# Patient Record
Sex: Female | Born: 1935 | Race: White | Hispanic: No | Smoking: Former smoker
Health system: Southern US, Community
[De-identification: ages and names within clinical notes are randomized; demographics above are authoritative.]

## PROBLEM LIST (undated history)

## (undated) DIAGNOSIS — N3010 Interstitial cystitis (chronic) without hematuria: Secondary | ICD-10-CM

## (undated) DIAGNOSIS — R000 Tachycardia, unspecified: Secondary | ICD-10-CM

## (undated) HISTORY — PX: CYSTOSTOMY W/ BLADDER BIOPSY: SHX1431

---

## 2006-05-01 ENCOUNTER — Encounter: Admission: RE | Admit: 2006-05-01 | Discharge: 2006-05-01 | Payer: Self-pay | Admitting: Urology

## 2006-05-05 ENCOUNTER — Ambulatory Visit (HOSPITAL_BASED_OUTPATIENT_CLINIC_OR_DEPARTMENT_OTHER): Admission: RE | Admit: 2006-05-05 | Discharge: 2006-05-05 | Payer: Self-pay | Admitting: Urology

## 2013-09-19 ENCOUNTER — Emergency Department (HOSPITAL_BASED_OUTPATIENT_CLINIC_OR_DEPARTMENT_OTHER)
Admission: EM | Admit: 2013-09-19 | Discharge: 2013-09-20 | Disposition: A | Discharge status: Home / Self Care | Payer: Medicare Other | Attending: Emergency Medicine | Admitting: Emergency Medicine

## 2013-09-19 ENCOUNTER — Encounter (HOSPITAL_BASED_OUTPATIENT_CLINIC_OR_DEPARTMENT_OTHER): Payer: Self-pay | Admitting: Emergency Medicine

## 2013-09-19 DIAGNOSIS — Y846 Urinary catheterization as the cause of abnormal reaction of the patient, or of later complication, without mention of misadventure at the time of the procedure: Secondary | ICD-10-CM | POA: Insufficient documentation

## 2013-09-19 DIAGNOSIS — T83091A Other mechanical complication of indwelling urethral catheter, initial encounter: Secondary | ICD-10-CM

## 2013-09-19 DIAGNOSIS — Z87448 Personal history of other diseases of urinary system: Secondary | ICD-10-CM | POA: Insufficient documentation

## 2013-09-19 DIAGNOSIS — T8389XA Other specified complication of genitourinary prosthetic devices, implants and grafts, initial encounter: Secondary | ICD-10-CM | POA: Insufficient documentation

## 2013-09-19 DIAGNOSIS — Z79899 Other long term (current) drug therapy: Secondary | ICD-10-CM | POA: Insufficient documentation

## 2013-09-19 HISTORY — DX: Interstitial cystitis (chronic) without hematuria: N30.10

## 2013-09-19 HISTORY — DX: Tachycardia, unspecified: R00.0

## 2013-09-19 NOTE — ED Notes (Signed)
States she had bladder surgery at [facility] today. Foley is stopped up and is having urinary retention. Abdominal pain.

## 2013-09-19 NOTE — ED Provider Notes (Signed)
CSN: [id number]     Arrival date & time 09/19/13  2205 History   First MD Initiated Contact with Patient 09/19/13 2347     This chart was scribed for Hanley Seamen, MD by Arlan Organ, ED Scribe. This patient was seen in room MH08/MH08 and the patient's care was started 11:52 PM.   Chief Complaint  Patient presents with  . Urinary Retention   HPI HPI Comments: Courtney Gamble is a 79 y.o. female who presents to the Emergency Department complaining of urinary retention that started today. Pt had a cystoscopy with biopsy for evaluation of interstitial cystitis. She was discharged home with an indwelling Foley catheter. Her Foley catheter became occluded and started causing her discomfort. Pt describes the pain as 9/10 at time of arrival, but has been resolved to 0/10 after the Foley was irrigated by her ED nurse. Pt denies nausea, vomiting, fever, chills.  Past Medical History  Diagnosis Date  . Tachycardia   . Interstitial cystitis    No past surgical history on file. No family history on file. History  Substance Use Topics  . Smoking status: Not on file  . Smokeless tobacco: Not on file  . Alcohol Use: Not on file   OB History   Grav Para Term Preterm Abortions TAB SAB Ect Mult Living                 Review of Systems  All other systems reviewed and are negative.    Allergies  Review of patient's allergies indicates not on file.  Home Medications   Current Outpatient Rx  Name  Route  Sig  Dispense  Refill  . amitriptyline (ELAVIL) 10 MG tablet   Oral   Take 10 mg by mouth at bedtime.         . Aspirin (ASPIR-81 PO)   Oral   Take by mouth.         . ATENOLOL PO   Oral   Take by mouth.         . diazepam (VALIUM) 5 MG tablet   Oral   Take 5 mg by mouth every 6 (six) hours as needed for anxiety.         . hydrOXYzine (ATARAX/VISTARIL) 10 MG tablet   Oral   Take 10 mg by mouth 3 (three) times daily as needed for itching.         Marland Kitchen oxybutynin  (DITROPAN) 5 MG tablet   Oral   Take 5 mg by mouth 3 (three) times daily.         . Sulfamethoxazole-Trimethoprim (BACTRIM DS PO)   Oral   Take by mouth.         . traMADol (ULTRAM) 50 MG tablet   Oral   Take 50 mg by mouth every 6 (six) hours as needed for pain.          BP 157/62  Pulse 82  Temp(Src) 98.1 F (36.7 C) (Oral)  Resp 20  Ht 5\' 6"  (1.676 m)  Wt 136 lb (61.689 kg)  BMI 21.96 kg/m2  SpO2 94%  Physical Exam  General: Well-developed, well-nourished female in no acute distress; appearance consistent with age of record HENT: normocephalic; atraumatic Eyes: pupils equal, round and reactive to light; extraocular muscles intact. Lens implants. Neck: supple Heart: regular rate and rhythm; no murmurs, rubs or gallops Lungs: clear to auscultation bilaterally Abdomen: soft; nondistended; mild suprapubic tenderness; no masses or hepatosplenomegaly; bowel sounds present Extremities: No deformity; full  range of motion; pulses normal Neurologic: Awake, alert and oriented; motor function intact in all extremities and symmetric; no facial droop Skin: Warm and dry Psychiatric: Normal mood and affect  GU: Foley catheter draining dark yellow urine.  ED Course  Procedures (including critical care time)  DIAGNOSTIC STUDIES: Oxygen Saturation is 94% on RA, Adequate by my interpretation.    COORDINATION OF CARE: 11:55 PM-Discussed treatment plan with pt at bedside and pt agreed to plan.    MDM   1. Obstructed Foley catheter, initial encounter    I personally performed the services described in this documentation, which was scribed in my presence.  The recorded information has been reviewed and is accurate.   Hanley Seamen, MD 09/20/13 0002

## 2013-09-20 ENCOUNTER — Encounter (HOSPITAL_BASED_OUTPATIENT_CLINIC_OR_DEPARTMENT_OTHER): Payer: Self-pay | Admitting: Emergency Medicine

## 2013-09-20 NOTE — ED Notes (Signed)
D/c home with family to drive- cao x 4

## 2015-11-28 DIAGNOSIS — M810 Age-related osteoporosis without current pathological fracture: Secondary | ICD-10-CM | POA: Diagnosis not present

## 2015-11-28 DIAGNOSIS — I2510 Atherosclerotic heart disease of native coronary artery without angina pectoris: Secondary | ICD-10-CM | POA: Diagnosis not present

## 2015-11-28 DIAGNOSIS — I252 Old myocardial infarction: Secondary | ICD-10-CM | POA: Diagnosis not present

## 2015-11-28 DIAGNOSIS — N3010 Interstitial cystitis (chronic) without hematuria: Secondary | ICD-10-CM | POA: Diagnosis not present

## 2015-11-28 DIAGNOSIS — E7800 Pure hypercholesterolemia, unspecified: Secondary | ICD-10-CM | POA: Diagnosis not present

## 2015-12-16 DIAGNOSIS — G4719 Other hypersomnia: Secondary | ICD-10-CM | POA: Diagnosis not present

## 2015-12-16 DIAGNOSIS — F411 Generalized anxiety disorder: Secondary | ICD-10-CM | POA: Diagnosis not present

## 2015-12-16 DIAGNOSIS — G4733 Obstructive sleep apnea (adult) (pediatric): Secondary | ICD-10-CM | POA: Diagnosis not present

## 2015-12-19 DIAGNOSIS — G4719 Other hypersomnia: Secondary | ICD-10-CM | POA: Diagnosis not present

## 2015-12-19 DIAGNOSIS — G4700 Insomnia, unspecified: Secondary | ICD-10-CM | POA: Diagnosis not present

## 2015-12-19 DIAGNOSIS — F411 Generalized anxiety disorder: Secondary | ICD-10-CM | POA: Diagnosis not present

## 2015-12-19 DIAGNOSIS — G4733 Obstructive sleep apnea (adult) (pediatric): Secondary | ICD-10-CM | POA: Diagnosis not present

## 2015-12-21 DIAGNOSIS — G4733 Obstructive sleep apnea (adult) (pediatric): Secondary | ICD-10-CM | POA: Diagnosis not present

## 2015-12-27 DIAGNOSIS — F411 Generalized anxiety disorder: Secondary | ICD-10-CM | POA: Diagnosis not present

## 2015-12-28 DIAGNOSIS — R350 Frequency of micturition: Secondary | ICD-10-CM | POA: Diagnosis not present

## 2015-12-28 DIAGNOSIS — N342 Other urethritis: Secondary | ICD-10-CM | POA: Diagnosis not present

## 2015-12-28 DIAGNOSIS — N3010 Interstitial cystitis (chronic) without hematuria: Secondary | ICD-10-CM | POA: Diagnosis not present

## 2015-12-28 DIAGNOSIS — R300 Dysuria: Secondary | ICD-10-CM | POA: Diagnosis not present

## 2015-12-28 DIAGNOSIS — E0590 Thyrotoxicosis, unspecified without thyrotoxic crisis or storm: Secondary | ICD-10-CM | POA: Diagnosis not present

## 2016-01-08 DIAGNOSIS — H04123 Dry eye syndrome of bilateral lacrimal glands: Secondary | ICD-10-CM | POA: Diagnosis not present

## 2016-01-08 DIAGNOSIS — H524 Presbyopia: Secondary | ICD-10-CM | POA: Diagnosis not present

## 2016-01-21 DIAGNOSIS — R350 Frequency of micturition: Secondary | ICD-10-CM | POA: Diagnosis not present

## 2016-01-21 DIAGNOSIS — R3989 Other symptoms and signs involving the genitourinary system: Secondary | ICD-10-CM | POA: Diagnosis not present

## 2016-01-21 DIAGNOSIS — E7800 Pure hypercholesterolemia, unspecified: Secondary | ICD-10-CM | POA: Diagnosis not present

## 2016-01-21 DIAGNOSIS — N3010 Interstitial cystitis (chronic) without hematuria: Secondary | ICD-10-CM | POA: Diagnosis not present

## 2016-01-22 DIAGNOSIS — N3941 Urge incontinence: Secondary | ICD-10-CM | POA: Diagnosis not present

## 2016-01-22 DIAGNOSIS — N3010 Interstitial cystitis (chronic) without hematuria: Secondary | ICD-10-CM | POA: Diagnosis not present

## 2016-01-23 DIAGNOSIS — E559 Vitamin D deficiency, unspecified: Secondary | ICD-10-CM | POA: Diagnosis not present

## 2016-01-23 DIAGNOSIS — E7800 Pure hypercholesterolemia, unspecified: Secondary | ICD-10-CM | POA: Diagnosis not present

## 2016-01-23 DIAGNOSIS — E0590 Thyrotoxicosis, unspecified without thyrotoxic crisis or storm: Secondary | ICD-10-CM | POA: Diagnosis not present

## 2016-01-24 DIAGNOSIS — N3010 Interstitial cystitis (chronic) without hematuria: Secondary | ICD-10-CM | POA: Diagnosis not present

## 2016-01-28 DIAGNOSIS — N3010 Interstitial cystitis (chronic) without hematuria: Secondary | ICD-10-CM | POA: Diagnosis not present

## 2016-01-29 DIAGNOSIS — N3010 Interstitial cystitis (chronic) without hematuria: Secondary | ICD-10-CM | POA: Diagnosis not present

## 2016-01-29 DIAGNOSIS — R339 Retention of urine, unspecified: Secondary | ICD-10-CM | POA: Diagnosis not present

## 2016-01-29 DIAGNOSIS — R3989 Other symptoms and signs involving the genitourinary system: Secondary | ICD-10-CM | POA: Diagnosis not present

## 2016-01-30 DIAGNOSIS — E0590 Thyrotoxicosis, unspecified without thyrotoxic crisis or storm: Secondary | ICD-10-CM | POA: Diagnosis not present

## 2016-01-30 DIAGNOSIS — R413 Other amnesia: Secondary | ICD-10-CM | POA: Diagnosis not present

## 2016-01-30 DIAGNOSIS — E559 Vitamin D deficiency, unspecified: Secondary | ICD-10-CM | POA: Diagnosis not present

## 2016-01-30 DIAGNOSIS — E7800 Pure hypercholesterolemia, unspecified: Secondary | ICD-10-CM | POA: Diagnosis not present

## 2016-01-30 DIAGNOSIS — K219 Gastro-esophageal reflux disease without esophagitis: Secondary | ICD-10-CM | POA: Diagnosis not present

## 2016-02-07 DIAGNOSIS — N3010 Interstitial cystitis (chronic) without hematuria: Secondary | ICD-10-CM | POA: Diagnosis not present

## 2016-02-11 DIAGNOSIS — N3010 Interstitial cystitis (chronic) without hematuria: Secondary | ICD-10-CM | POA: Diagnosis not present

## 2016-02-14 DIAGNOSIS — N3010 Interstitial cystitis (chronic) without hematuria: Secondary | ICD-10-CM | POA: Diagnosis not present

## 2016-02-18 DIAGNOSIS — N3010 Interstitial cystitis (chronic) without hematuria: Secondary | ICD-10-CM | POA: Diagnosis not present

## 2016-02-21 DIAGNOSIS — N3010 Interstitial cystitis (chronic) without hematuria: Secondary | ICD-10-CM | POA: Diagnosis not present

## 2016-02-25 DIAGNOSIS — R413 Other amnesia: Secondary | ICD-10-CM | POA: Diagnosis not present

## 2016-02-25 DIAGNOSIS — N3010 Interstitial cystitis (chronic) without hematuria: Secondary | ICD-10-CM | POA: Diagnosis not present

## 2016-02-25 DIAGNOSIS — I10 Essential (primary) hypertension: Secondary | ICD-10-CM | POA: Diagnosis not present

## 2016-02-25 DIAGNOSIS — M25511 Pain in right shoulder: Secondary | ICD-10-CM | POA: Diagnosis not present

## 2016-02-25 DIAGNOSIS — M79605 Pain in left leg: Secondary | ICD-10-CM | POA: Diagnosis not present

## 2016-02-29 DIAGNOSIS — M79605 Pain in left leg: Secondary | ICD-10-CM | POA: Diagnosis not present

## 2016-03-05 DIAGNOSIS — G301 Alzheimer's disease with late onset: Secondary | ICD-10-CM | POA: Diagnosis not present

## 2016-03-05 DIAGNOSIS — F0280 Dementia in other diseases classified elsewhere without behavioral disturbance: Secondary | ICD-10-CM | POA: Diagnosis not present

## 2016-03-05 DIAGNOSIS — R413 Other amnesia: Secondary | ICD-10-CM | POA: Diagnosis not present

## 2016-03-05 DIAGNOSIS — I10 Essential (primary) hypertension: Secondary | ICD-10-CM | POA: Diagnosis not present

## 2016-03-05 DIAGNOSIS — M79601 Pain in right arm: Secondary | ICD-10-CM | POA: Diagnosis not present

## 2016-03-14 DIAGNOSIS — N3010 Interstitial cystitis (chronic) without hematuria: Secondary | ICD-10-CM | POA: Diagnosis not present

## 2016-03-14 DIAGNOSIS — N390 Urinary tract infection, site not specified: Secondary | ICD-10-CM | POA: Diagnosis not present

## 2016-03-17 DIAGNOSIS — N3010 Interstitial cystitis (chronic) without hematuria: Secondary | ICD-10-CM | POA: Diagnosis not present

## 2016-03-19 DIAGNOSIS — N3010 Interstitial cystitis (chronic) without hematuria: Secondary | ICD-10-CM | POA: Diagnosis not present

## 2016-03-19 DIAGNOSIS — N390 Urinary tract infection, site not specified: Secondary | ICD-10-CM | POA: Diagnosis not present

## 2016-03-29 DIAGNOSIS — R300 Dysuria: Secondary | ICD-10-CM | POA: Diagnosis not present

## 2016-03-31 DIAGNOSIS — R300 Dysuria: Secondary | ICD-10-CM | POA: Diagnosis not present

## 2016-03-31 DIAGNOSIS — R3989 Other symptoms and signs involving the genitourinary system: Secondary | ICD-10-CM | POA: Diagnosis not present

## 2016-03-31 DIAGNOSIS — N3010 Interstitial cystitis (chronic) without hematuria: Secondary | ICD-10-CM | POA: Diagnosis not present

## 2016-04-01 DIAGNOSIS — N3010 Interstitial cystitis (chronic) without hematuria: Secondary | ICD-10-CM | POA: Diagnosis not present

## 2016-04-03 DIAGNOSIS — N3010 Interstitial cystitis (chronic) without hematuria: Secondary | ICD-10-CM | POA: Diagnosis not present

## 2016-04-09 DIAGNOSIS — M25511 Pain in right shoulder: Secondary | ICD-10-CM | POA: Diagnosis not present

## 2016-04-09 DIAGNOSIS — M25411 Effusion, right shoulder: Secondary | ICD-10-CM | POA: Diagnosis not present

## 2016-04-09 DIAGNOSIS — M7989 Other specified soft tissue disorders: Secondary | ICD-10-CM | POA: Diagnosis not present

## 2016-04-09 DIAGNOSIS — G8929 Other chronic pain: Secondary | ICD-10-CM | POA: Diagnosis not present

## 2016-04-17 DIAGNOSIS — M25611 Stiffness of right shoulder, not elsewhere classified: Secondary | ICD-10-CM | POA: Diagnosis not present

## 2016-04-17 DIAGNOSIS — M7551 Bursitis of right shoulder: Secondary | ICD-10-CM | POA: Diagnosis not present

## 2016-04-17 DIAGNOSIS — M65811 Other synovitis and tenosynovitis, right shoulder: Secondary | ICD-10-CM | POA: Diagnosis not present

## 2016-04-17 DIAGNOSIS — M25511 Pain in right shoulder: Secondary | ICD-10-CM | POA: Diagnosis not present

## 2016-04-17 DIAGNOSIS — M25411 Effusion, right shoulder: Secondary | ICD-10-CM | POA: Diagnosis not present

## 2016-04-17 DIAGNOSIS — G8929 Other chronic pain: Secondary | ICD-10-CM | POA: Diagnosis not present

## 2016-05-09 DIAGNOSIS — M7581 Other shoulder lesions, right shoulder: Secondary | ICD-10-CM | POA: Diagnosis not present

## 2016-05-09 DIAGNOSIS — G8929 Other chronic pain: Secondary | ICD-10-CM | POA: Diagnosis not present

## 2016-05-09 DIAGNOSIS — M25711 Osteophyte, right shoulder: Secondary | ICD-10-CM | POA: Diagnosis not present

## 2016-05-09 DIAGNOSIS — S46111A Strain of muscle, fascia and tendon of long head of biceps, right arm, initial encounter: Secondary | ICD-10-CM | POA: Diagnosis not present

## 2016-05-09 DIAGNOSIS — M7551 Bursitis of right shoulder: Secondary | ICD-10-CM | POA: Diagnosis not present

## 2016-05-09 DIAGNOSIS — M25511 Pain in right shoulder: Secondary | ICD-10-CM | POA: Diagnosis not present

## 2016-05-09 DIAGNOSIS — M659 Synovitis and tenosynovitis, unspecified: Secondary | ICD-10-CM | POA: Diagnosis not present

## 2016-05-09 DIAGNOSIS — M19011 Primary osteoarthritis, right shoulder: Secondary | ICD-10-CM | POA: Diagnosis not present

## 2016-05-26 DIAGNOSIS — I10 Essential (primary) hypertension: Secondary | ICD-10-CM | POA: Diagnosis not present

## 2016-05-26 DIAGNOSIS — E559 Vitamin D deficiency, unspecified: Secondary | ICD-10-CM | POA: Diagnosis not present

## 2016-05-26 DIAGNOSIS — E0590 Thyrotoxicosis, unspecified without thyrotoxic crisis or storm: Secondary | ICD-10-CM | POA: Diagnosis not present

## 2016-05-26 DIAGNOSIS — E7800 Pure hypercholesterolemia, unspecified: Secondary | ICD-10-CM | POA: Diagnosis not present

## 2016-05-26 DIAGNOSIS — R413 Other amnesia: Secondary | ICD-10-CM | POA: Diagnosis not present

## 2016-06-02 DIAGNOSIS — E7800 Pure hypercholesterolemia, unspecified: Secondary | ICD-10-CM | POA: Diagnosis not present

## 2016-06-02 DIAGNOSIS — M659 Synovitis and tenosynovitis, unspecified: Secondary | ICD-10-CM | POA: Diagnosis not present

## 2016-06-02 DIAGNOSIS — G301 Alzheimer's disease with late onset: Secondary | ICD-10-CM | POA: Diagnosis not present

## 2016-06-02 DIAGNOSIS — I10 Essential (primary) hypertension: Secondary | ICD-10-CM | POA: Diagnosis not present

## 2016-06-02 DIAGNOSIS — F0280 Dementia in other diseases classified elsewhere without behavioral disturbance: Secondary | ICD-10-CM | POA: Diagnosis not present

## 2016-07-11 DIAGNOSIS — N3010 Interstitial cystitis (chronic) without hematuria: Secondary | ICD-10-CM | POA: Diagnosis not present

## 2016-07-11 DIAGNOSIS — R3989 Other symptoms and signs involving the genitourinary system: Secondary | ICD-10-CM | POA: Diagnosis not present

## 2016-07-11 DIAGNOSIS — R350 Frequency of micturition: Secondary | ICD-10-CM | POA: Diagnosis not present

## 2016-07-11 DIAGNOSIS — R300 Dysuria: Secondary | ICD-10-CM | POA: Diagnosis not present

## 2016-07-21 DIAGNOSIS — R3989 Other symptoms and signs involving the genitourinary system: Secondary | ICD-10-CM | POA: Diagnosis not present

## 2016-07-25 DIAGNOSIS — Z1231 Encounter for screening mammogram for malignant neoplasm of breast: Secondary | ICD-10-CM | POA: Diagnosis not present

## 2016-07-25 DIAGNOSIS — N390 Urinary tract infection, site not specified: Secondary | ICD-10-CM | POA: Diagnosis not present

## 2016-07-25 DIAGNOSIS — N3010 Interstitial cystitis (chronic) without hematuria: Secondary | ICD-10-CM | POA: Diagnosis not present

## 2016-08-11 DIAGNOSIS — F411 Generalized anxiety disorder: Secondary | ICD-10-CM | POA: Diagnosis not present

## 2016-09-19 DIAGNOSIS — J069 Acute upper respiratory infection, unspecified: Secondary | ICD-10-CM | POA: Diagnosis not present

## 2016-10-08 DIAGNOSIS — I10 Essential (primary) hypertension: Secondary | ICD-10-CM | POA: Diagnosis not present

## 2016-10-08 DIAGNOSIS — E559 Vitamin D deficiency, unspecified: Secondary | ICD-10-CM | POA: Diagnosis not present

## 2016-10-08 DIAGNOSIS — E7800 Pure hypercholesterolemia, unspecified: Secondary | ICD-10-CM | POA: Diagnosis not present

## 2016-10-08 DIAGNOSIS — E0590 Thyrotoxicosis, unspecified without thyrotoxic crisis or storm: Secondary | ICD-10-CM | POA: Diagnosis not present

## 2016-10-13 DIAGNOSIS — G301 Alzheimer's disease with late onset: Secondary | ICD-10-CM | POA: Diagnosis not present

## 2016-10-13 DIAGNOSIS — E0590 Thyrotoxicosis, unspecified without thyrotoxic crisis or storm: Secondary | ICD-10-CM | POA: Diagnosis not present

## 2016-10-13 DIAGNOSIS — C50919 Malignant neoplasm of unspecified site of unspecified female breast: Secondary | ICD-10-CM | POA: Diagnosis not present

## 2016-10-13 DIAGNOSIS — K219 Gastro-esophageal reflux disease without esophagitis: Secondary | ICD-10-CM | POA: Diagnosis not present

## 2016-10-13 DIAGNOSIS — I479 Paroxysmal tachycardia, unspecified: Secondary | ICD-10-CM | POA: Diagnosis not present

## 2016-10-13 DIAGNOSIS — F411 Generalized anxiety disorder: Secondary | ICD-10-CM | POA: Diagnosis not present

## 2016-10-13 DIAGNOSIS — I252 Old myocardial infarction: Secondary | ICD-10-CM | POA: Diagnosis not present

## 2016-10-13 DIAGNOSIS — M810 Age-related osteoporosis without current pathological fracture: Secondary | ICD-10-CM | POA: Diagnosis not present

## 2016-10-13 DIAGNOSIS — F0280 Dementia in other diseases classified elsewhere without behavioral disturbance: Secondary | ICD-10-CM | POA: Diagnosis not present

## 2016-10-13 DIAGNOSIS — Z23 Encounter for immunization: Secondary | ICD-10-CM | POA: Diagnosis not present

## 2016-10-13 DIAGNOSIS — I10 Essential (primary) hypertension: Secondary | ICD-10-CM | POA: Diagnosis not present

## 2016-10-13 DIAGNOSIS — E7800 Pure hypercholesterolemia, unspecified: Secondary | ICD-10-CM | POA: Diagnosis not present

## 2016-11-03 DIAGNOSIS — F411 Generalized anxiety disorder: Secondary | ICD-10-CM | POA: Diagnosis not present

## 2016-11-03 DIAGNOSIS — R413 Other amnesia: Secondary | ICD-10-CM | POA: Diagnosis not present

## 2016-11-06 DIAGNOSIS — N3010 Interstitial cystitis (chronic) without hematuria: Secondary | ICD-10-CM | POA: Diagnosis not present

## 2016-11-06 DIAGNOSIS — I10 Essential (primary) hypertension: Secondary | ICD-10-CM | POA: Diagnosis not present

## 2016-11-06 DIAGNOSIS — E785 Hyperlipidemia, unspecified: Secondary | ICD-10-CM | POA: Diagnosis not present

## 2016-11-06 DIAGNOSIS — I252 Old myocardial infarction: Secondary | ICD-10-CM | POA: Diagnosis not present

## 2016-11-06 DIAGNOSIS — G4733 Obstructive sleep apnea (adult) (pediatric): Secondary | ICD-10-CM | POA: Diagnosis not present

## 2016-11-06 DIAGNOSIS — I2510 Atherosclerotic heart disease of native coronary artery without angina pectoris: Secondary | ICD-10-CM | POA: Diagnosis not present

## 2016-11-06 DIAGNOSIS — M810 Age-related osteoporosis without current pathological fracture: Secondary | ICD-10-CM | POA: Diagnosis not present

## 2016-11-06 DIAGNOSIS — Z9861 Coronary angioplasty status: Secondary | ICD-10-CM | POA: Diagnosis not present

## 2016-11-21 DIAGNOSIS — R05 Cough: Secondary | ICD-10-CM | POA: Diagnosis not present

## 2016-11-21 DIAGNOSIS — J309 Allergic rhinitis, unspecified: Secondary | ICD-10-CM | POA: Diagnosis not present

## 2016-12-02 DIAGNOSIS — R350 Frequency of micturition: Secondary | ICD-10-CM | POA: Diagnosis not present

## 2016-12-02 DIAGNOSIS — R3989 Other symptoms and signs involving the genitourinary system: Secondary | ICD-10-CM | POA: Diagnosis not present

## 2016-12-16 DIAGNOSIS — N390 Urinary tract infection, site not specified: Secondary | ICD-10-CM | POA: Diagnosis not present

## 2016-12-25 DIAGNOSIS — R3989 Other symptoms and signs involving the genitourinary system: Secondary | ICD-10-CM | POA: Diagnosis not present

## 2016-12-29 DIAGNOSIS — N3010 Interstitial cystitis (chronic) without hematuria: Secondary | ICD-10-CM | POA: Diagnosis not present

## 2016-12-30 DIAGNOSIS — N3010 Interstitial cystitis (chronic) without hematuria: Secondary | ICD-10-CM | POA: Diagnosis not present

## 2016-12-31 DIAGNOSIS — N3010 Interstitial cystitis (chronic) without hematuria: Secondary | ICD-10-CM | POA: Diagnosis not present

## 2016-12-31 DIAGNOSIS — N390 Urinary tract infection, site not specified: Secondary | ICD-10-CM | POA: Diagnosis not present

## 2016-12-31 DIAGNOSIS — I10 Essential (primary) hypertension: Secondary | ICD-10-CM | POA: Diagnosis not present

## 2017-01-12 DIAGNOSIS — I10 Essential (primary) hypertension: Secondary | ICD-10-CM | POA: Diagnosis not present

## 2017-01-12 DIAGNOSIS — R3989 Other symptoms and signs involving the genitourinary system: Secondary | ICD-10-CM | POA: Diagnosis not present

## 2017-01-28 DIAGNOSIS — H04123 Dry eye syndrome of bilateral lacrimal glands: Secondary | ICD-10-CM | POA: Diagnosis not present

## 2017-01-28 DIAGNOSIS — H524 Presbyopia: Secondary | ICD-10-CM | POA: Diagnosis not present

## 2017-02-10 DIAGNOSIS — I10 Essential (primary) hypertension: Secondary | ICD-10-CM | POA: Diagnosis not present

## 2017-02-10 DIAGNOSIS — E0590 Thyrotoxicosis, unspecified without thyrotoxic crisis or storm: Secondary | ICD-10-CM | POA: Diagnosis not present

## 2017-02-10 DIAGNOSIS — E7800 Pure hypercholesterolemia, unspecified: Secondary | ICD-10-CM | POA: Diagnosis not present

## 2017-02-10 DIAGNOSIS — E559 Vitamin D deficiency, unspecified: Secondary | ICD-10-CM | POA: Diagnosis not present

## 2017-02-12 DIAGNOSIS — F411 Generalized anxiety disorder: Secondary | ICD-10-CM | POA: Diagnosis not present

## 2017-02-25 DIAGNOSIS — N3010 Interstitial cystitis (chronic) without hematuria: Secondary | ICD-10-CM | POA: Diagnosis not present

## 2017-02-25 DIAGNOSIS — R3989 Other symptoms and signs involving the genitourinary system: Secondary | ICD-10-CM | POA: Diagnosis not present

## 2017-03-31 DIAGNOSIS — K219 Gastro-esophageal reflux disease without esophagitis: Secondary | ICD-10-CM | POA: Diagnosis not present

## 2017-03-31 DIAGNOSIS — G301 Alzheimer's disease with late onset: Secondary | ICD-10-CM | POA: Diagnosis not present

## 2017-03-31 DIAGNOSIS — F0280 Dementia in other diseases classified elsewhere without behavioral disturbance: Secondary | ICD-10-CM | POA: Diagnosis not present

## 2017-03-31 DIAGNOSIS — I10 Essential (primary) hypertension: Secondary | ICD-10-CM | POA: Diagnosis not present

## 2017-03-31 DIAGNOSIS — E7800 Pure hypercholesterolemia, unspecified: Secondary | ICD-10-CM | POA: Diagnosis not present

## 2017-03-31 DIAGNOSIS — E0590 Thyrotoxicosis, unspecified without thyrotoxic crisis or storm: Secondary | ICD-10-CM | POA: Diagnosis not present

## 2017-04-08 DIAGNOSIS — N3010 Interstitial cystitis (chronic) without hematuria: Secondary | ICD-10-CM | POA: Diagnosis not present

## 2017-04-08 DIAGNOSIS — R3989 Other symptoms and signs involving the genitourinary system: Secondary | ICD-10-CM | POA: Diagnosis not present

## 2017-04-10 DIAGNOSIS — N3010 Interstitial cystitis (chronic) without hematuria: Secondary | ICD-10-CM | POA: Diagnosis not present

## 2017-04-13 DIAGNOSIS — N3010 Interstitial cystitis (chronic) without hematuria: Secondary | ICD-10-CM | POA: Diagnosis not present

## 2017-04-17 DIAGNOSIS — N3010 Interstitial cystitis (chronic) without hematuria: Secondary | ICD-10-CM | POA: Diagnosis not present

## 2017-04-21 DIAGNOSIS — N3010 Interstitial cystitis (chronic) without hematuria: Secondary | ICD-10-CM | POA: Diagnosis not present

## 2017-04-24 DIAGNOSIS — N3010 Interstitial cystitis (chronic) without hematuria: Secondary | ICD-10-CM | POA: Diagnosis not present

## 2017-04-27 DIAGNOSIS — N3010 Interstitial cystitis (chronic) without hematuria: Secondary | ICD-10-CM | POA: Diagnosis not present

## 2017-05-15 DIAGNOSIS — R309 Painful micturition, unspecified: Secondary | ICD-10-CM | POA: Diagnosis not present

## 2017-05-15 DIAGNOSIS — N3010 Interstitial cystitis (chronic) without hematuria: Secondary | ICD-10-CM | POA: Diagnosis not present

## 2017-06-05 DIAGNOSIS — F411 Generalized anxiety disorder: Secondary | ICD-10-CM | POA: Diagnosis not present

## 2017-07-01 DIAGNOSIS — R3982 Chronic bladder pain: Secondary | ICD-10-CM | POA: Diagnosis not present

## 2017-07-29 DIAGNOSIS — N390 Urinary tract infection, site not specified: Secondary | ICD-10-CM | POA: Diagnosis not present

## 2017-07-29 DIAGNOSIS — N3010 Interstitial cystitis (chronic) without hematuria: Secondary | ICD-10-CM | POA: Diagnosis not present

## 2017-09-10 DIAGNOSIS — F411 Generalized anxiety disorder: Secondary | ICD-10-CM | POA: Diagnosis not present

## 2017-09-10 DIAGNOSIS — F09 Unspecified mental disorder due to known physiological condition: Secondary | ICD-10-CM | POA: Diagnosis not present

## 2017-10-13 DIAGNOSIS — K5900 Constipation, unspecified: Secondary | ICD-10-CM | POA: Diagnosis not present

## 2017-10-13 DIAGNOSIS — M80051D Age-related osteoporosis with current pathological fracture, right femur, subsequent encounter for fracture with routine healing: Secondary | ICD-10-CM | POA: Diagnosis not present

## 2017-10-13 DIAGNOSIS — R102 Pelvic and perineal pain: Secondary | ICD-10-CM | POA: Diagnosis not present

## 2017-10-13 DIAGNOSIS — S79911A Unspecified injury of right hip, initial encounter: Secondary | ICD-10-CM | POA: Diagnosis not present

## 2017-10-13 DIAGNOSIS — F411 Generalized anxiety disorder: Secondary | ICD-10-CM | POA: Diagnosis not present

## 2017-10-13 DIAGNOSIS — I493 Ventricular premature depolarization: Secondary | ICD-10-CM | POA: Diagnosis not present

## 2017-10-13 DIAGNOSIS — M6281 Muscle weakness (generalized): Secondary | ICD-10-CM | POA: Diagnosis not present

## 2017-10-13 DIAGNOSIS — R269 Unspecified abnormalities of gait and mobility: Secondary | ICD-10-CM | POA: Diagnosis not present

## 2017-10-13 DIAGNOSIS — E785 Hyperlipidemia, unspecified: Secondary | ICD-10-CM | POA: Diagnosis not present

## 2017-10-13 DIAGNOSIS — R41841 Cognitive communication deficit: Secondary | ICD-10-CM | POA: Diagnosis not present

## 2017-10-13 DIAGNOSIS — R110 Nausea: Secondary | ICD-10-CM | POA: Diagnosis not present

## 2017-10-13 DIAGNOSIS — M80051A Age-related osteoporosis with current pathological fracture, right femur, initial encounter for fracture: Secondary | ICD-10-CM | POA: Diagnosis not present

## 2017-10-13 DIAGNOSIS — E876 Hypokalemia: Secondary | ICD-10-CM | POA: Diagnosis not present

## 2017-10-13 DIAGNOSIS — G309 Alzheimer's disease, unspecified: Secondary | ICD-10-CM | POA: Diagnosis not present

## 2017-10-13 DIAGNOSIS — R339 Retention of urine, unspecified: Secondary | ICD-10-CM | POA: Diagnosis not present

## 2017-10-13 DIAGNOSIS — S299XXA Unspecified injury of thorax, initial encounter: Secondary | ICD-10-CM | POA: Diagnosis not present

## 2017-10-13 DIAGNOSIS — Z01810 Encounter for preprocedural cardiovascular examination: Secondary | ICD-10-CM | POA: Diagnosis not present

## 2017-10-13 DIAGNOSIS — W19XXXA Unspecified fall, initial encounter: Secondary | ICD-10-CM | POA: Diagnosis not present

## 2017-10-13 DIAGNOSIS — S72001A Fracture of unspecified part of neck of right femur, initial encounter for closed fracture: Secondary | ICD-10-CM | POA: Diagnosis not present

## 2017-10-13 DIAGNOSIS — S8991XA Unspecified injury of right lower leg, initial encounter: Secondary | ICD-10-CM | POA: Diagnosis not present

## 2017-10-13 DIAGNOSIS — R079 Chest pain, unspecified: Secondary | ICD-10-CM | POA: Diagnosis not present

## 2017-10-13 DIAGNOSIS — T1490XA Injury, unspecified, initial encounter: Secondary | ICD-10-CM | POA: Diagnosis not present

## 2017-10-13 DIAGNOSIS — E7849 Other hyperlipidemia: Secondary | ICD-10-CM | POA: Diagnosis not present

## 2017-10-13 DIAGNOSIS — I2510 Atherosclerotic heart disease of native coronary artery without angina pectoris: Secondary | ICD-10-CM | POA: Diagnosis not present

## 2017-10-13 DIAGNOSIS — Z955 Presence of coronary angioplasty implant and graft: Secondary | ICD-10-CM | POA: Diagnosis not present

## 2017-10-13 DIAGNOSIS — R2681 Unsteadiness on feet: Secondary | ICD-10-CM | POA: Diagnosis not present

## 2017-10-13 DIAGNOSIS — T148XXA Other injury of unspecified body region, initial encounter: Secondary | ICD-10-CM | POA: Diagnosis not present

## 2017-10-13 DIAGNOSIS — I10 Essential (primary) hypertension: Secondary | ICD-10-CM | POA: Diagnosis not present

## 2017-10-13 DIAGNOSIS — S72041D Displaced fracture of base of neck of right femur, subsequent encounter for closed fracture with routine healing: Secondary | ICD-10-CM | POA: Diagnosis not present

## 2017-10-13 DIAGNOSIS — K219 Gastro-esophageal reflux disease without esophagitis: Secondary | ICD-10-CM | POA: Diagnosis not present

## 2017-10-13 DIAGNOSIS — R4182 Altered mental status, unspecified: Secondary | ICD-10-CM | POA: Diagnosis not present

## 2017-10-13 DIAGNOSIS — Z96641 Presence of right artificial hip joint: Secondary | ICD-10-CM | POA: Diagnosis not present

## 2017-10-13 DIAGNOSIS — I252 Old myocardial infarction: Secondary | ICD-10-CM | POA: Diagnosis not present

## 2017-10-13 DIAGNOSIS — W19XXXD Unspecified fall, subsequent encounter: Secondary | ICD-10-CM | POA: Diagnosis not present

## 2017-10-13 DIAGNOSIS — F0280 Dementia in other diseases classified elsewhere without behavioral disturbance: Secondary | ICD-10-CM | POA: Diagnosis not present

## 2017-10-13 DIAGNOSIS — M25551 Pain in right hip: Secondary | ICD-10-CM | POA: Diagnosis not present

## 2017-10-13 DIAGNOSIS — Z853 Personal history of malignant neoplasm of breast: Secondary | ICD-10-CM | POA: Diagnosis not present

## 2017-10-13 DIAGNOSIS — Z471 Aftercare following joint replacement surgery: Secondary | ICD-10-CM | POA: Diagnosis not present

## 2017-10-13 DIAGNOSIS — Z9012 Acquired absence of left breast and nipple: Secondary | ICD-10-CM | POA: Diagnosis not present

## 2017-10-13 DIAGNOSIS — Z9049 Acquired absence of other specified parts of digestive tract: Secondary | ICD-10-CM | POA: Diagnosis not present

## 2017-10-20 DIAGNOSIS — S8991XA Unspecified injury of right lower leg, initial encounter: Secondary | ICD-10-CM | POA: Diagnosis not present

## 2017-10-20 DIAGNOSIS — W19XXXD Unspecified fall, subsequent encounter: Secondary | ICD-10-CM | POA: Diagnosis not present

## 2017-10-20 DIAGNOSIS — F09 Unspecified mental disorder due to known physiological condition: Secondary | ICD-10-CM | POA: Diagnosis not present

## 2017-10-20 DIAGNOSIS — G309 Alzheimer's disease, unspecified: Secondary | ICD-10-CM | POA: Diagnosis not present

## 2017-10-20 DIAGNOSIS — E785 Hyperlipidemia, unspecified: Secondary | ICD-10-CM | POA: Diagnosis not present

## 2017-10-20 DIAGNOSIS — I10 Essential (primary) hypertension: Secondary | ICD-10-CM | POA: Diagnosis not present

## 2017-10-20 DIAGNOSIS — M80051D Age-related osteoporosis with current pathological fracture, right femur, subsequent encounter for fracture with routine healing: Secondary | ICD-10-CM | POA: Diagnosis not present

## 2017-10-20 DIAGNOSIS — K219 Gastro-esophageal reflux disease without esophagitis: Secondary | ICD-10-CM | POA: Diagnosis not present

## 2017-10-20 DIAGNOSIS — S72041D Displaced fracture of base of neck of right femur, subsequent encounter for closed fracture with routine healing: Secondary | ICD-10-CM | POA: Diagnosis not present

## 2017-10-20 DIAGNOSIS — R339 Retention of urine, unspecified: Secondary | ICD-10-CM | POA: Diagnosis not present

## 2017-10-20 DIAGNOSIS — M6281 Muscle weakness (generalized): Secondary | ICD-10-CM | POA: Diagnosis not present

## 2017-10-20 DIAGNOSIS — R41841 Cognitive communication deficit: Secondary | ICD-10-CM | POA: Diagnosis not present

## 2017-10-20 DIAGNOSIS — F0280 Dementia in other diseases classified elsewhere without behavioral disturbance: Secondary | ICD-10-CM | POA: Diagnosis not present

## 2017-10-20 DIAGNOSIS — F411 Generalized anxiety disorder: Secondary | ICD-10-CM | POA: Diagnosis not present

## 2017-10-20 DIAGNOSIS — S72001A Fracture of unspecified part of neck of right femur, initial encounter for closed fracture: Secondary | ICD-10-CM | POA: Diagnosis not present

## 2017-10-20 DIAGNOSIS — R4182 Altered mental status, unspecified: Secondary | ICD-10-CM | POA: Diagnosis not present

## 2017-10-20 DIAGNOSIS — Z955 Presence of coronary angioplasty implant and graft: Secondary | ICD-10-CM | POA: Diagnosis not present

## 2017-10-20 DIAGNOSIS — Z658 Other specified problems related to psychosocial circumstances: Secondary | ICD-10-CM | POA: Diagnosis not present

## 2017-10-20 DIAGNOSIS — R2681 Unsteadiness on feet: Secondary | ICD-10-CM | POA: Diagnosis not present

## 2017-10-20 DIAGNOSIS — E876 Hypokalemia: Secondary | ICD-10-CM | POA: Diagnosis not present

## 2017-10-20 DIAGNOSIS — Z96641 Presence of right artificial hip joint: Secondary | ICD-10-CM | POA: Diagnosis not present

## 2017-10-22 DIAGNOSIS — F09 Unspecified mental disorder due to known physiological condition: Secondary | ICD-10-CM | POA: Diagnosis not present

## 2017-10-22 DIAGNOSIS — F411 Generalized anxiety disorder: Secondary | ICD-10-CM | POA: Diagnosis not present

## 2017-10-22 DIAGNOSIS — G309 Alzheimer's disease, unspecified: Secondary | ICD-10-CM | POA: Diagnosis not present

## 2017-10-26 ENCOUNTER — Other Ambulatory Visit: Payer: Self-pay

## 2017-10-26 NOTE — Patient Outreach (Signed)
[place] [facility]) Care Management  10/26/2017  Courtney Gamble 24-Nov-1929 [id number]  Transition of care  Referral date: 10/26/17 Referral source: discharged from [facility] on 10/20/17 Insurance: Health team advantage Attempt #1  Telephone call to patient regarding transition of care referral. Unable to reach patient or leave voice message.  Message states, "enter access code."  PLAN: RNCM will attempt 2nd telephone call to patient within 3 business days.   Quinn Plowman RN,BSN,CCM [facility] Telephonic  [phone]

## 2017-10-27 ENCOUNTER — Other Ambulatory Visit: Payer: Self-pay

## 2017-10-27 NOTE — Patient Outreach (Signed)
[place] [facility]) Care Management  10/27/2017  [name] 09/21/1929 [id number]   Transition of care  Referral date: 10/26/17 Referral source: discharged from [facility] on 10/20/17 Insurance: Health team advantage Attempt #2  Telephone call to patient regarding transition of care referral. Unable to reach patient or leave voice message.  Message states, "enter access code." Attempted alternate phone number. Message states, " number cannot be reached as dialed." Attempted phone number twice.   PLAN;  RNCM will attempt 3rd telephone call to patient within 3 business days.   Quinn Plowman RN,BSN,CCM [facility] Telephonic  [phone]

## 2017-10-28 ENCOUNTER — Other Ambulatory Visit: Payer: Self-pay

## 2017-10-28 ENCOUNTER — Ambulatory Visit: Payer: Self-pay

## 2017-10-28 NOTE — Patient Outreach (Signed)
[place] [facility]) Care Management  10/28/2017  Courtney Gamble 12-15-1928 [id number]  Transition of care  Referral date:10/26/17 Referral source:discharged from [facility] on 10/20/17 Insurance:Health team advantage Attempt #3  Telephone call to patient regarding transition of care referral. Unable to reach patient or leave voice message. Message states, "enter access code." Attempted alternate phone number.  Unable to reach patient. HIPAA compliant voice message left with call back phone number.   PLAN;  RNCM will send outreach letter to attempt contact.   Quinn Plowman RN,BSN,CCM [facility] Telephonic  [phone]

## 2017-10-29 DIAGNOSIS — F411 Generalized anxiety disorder: Secondary | ICD-10-CM | POA: Diagnosis not present

## 2017-10-29 DIAGNOSIS — F0280 Dementia in other diseases classified elsewhere without behavioral disturbance: Secondary | ICD-10-CM | POA: Diagnosis not present

## 2017-10-29 DIAGNOSIS — E876 Hypokalemia: Secondary | ICD-10-CM | POA: Diagnosis not present

## 2017-10-29 DIAGNOSIS — G309 Alzheimer's disease, unspecified: Secondary | ICD-10-CM | POA: Diagnosis not present

## 2017-11-03 DIAGNOSIS — F0280 Dementia in other diseases classified elsewhere without behavioral disturbance: Secondary | ICD-10-CM | POA: Diagnosis not present

## 2017-11-03 DIAGNOSIS — K219 Gastro-esophageal reflux disease without esophagitis: Secondary | ICD-10-CM | POA: Diagnosis not present

## 2017-11-03 DIAGNOSIS — F411 Generalized anxiety disorder: Secondary | ICD-10-CM | POA: Diagnosis not present

## 2017-11-03 DIAGNOSIS — E876 Hypokalemia: Secondary | ICD-10-CM | POA: Diagnosis not present

## 2017-11-10 ENCOUNTER — Other Ambulatory Visit: Payer: Self-pay

## 2017-11-10 NOTE — Patient Outreach (Signed)
[place]) Care Management  11/10/2017  KAMAIYAH USELTON [birth year] [id number]  Care Coordination Health team advantage referral Referral received from: [employer] team advantage.   Notification received from health team advantage, [facility] that patient is currently in a skilled nursing facility with a potential discharge date of today 11/10/17.  PLAN: RNCM will outreach to  Patient within 24 to 48 hours for transition of care follow up.   Quinn Plowman RN,BSN,CCM [facility] Telephonic  [phone]

## 2017-11-11 ENCOUNTER — Other Ambulatory Visit: Payer: Self-pay

## 2017-11-11 DIAGNOSIS — M80051D Age-related osteoporosis with current pathological fracture, right femur, subsequent encounter for fracture with routine healing: Secondary | ICD-10-CM | POA: Diagnosis not present

## 2017-11-11 DIAGNOSIS — S72041D Displaced fracture of base of neck of right femur, subsequent encounter for closed fracture with routine healing: Secondary | ICD-10-CM | POA: Diagnosis not present

## 2017-11-11 DIAGNOSIS — W19XXXD Unspecified fall, subsequent encounter: Secondary | ICD-10-CM | POA: Diagnosis not present

## 2017-11-11 DIAGNOSIS — Z96641 Presence of right artificial hip joint: Secondary | ICD-10-CM | POA: Diagnosis not present

## 2017-11-11 DIAGNOSIS — M6281 Muscle weakness (generalized): Secondary | ICD-10-CM | POA: Diagnosis not present

## 2017-11-11 DIAGNOSIS — R2681 Unsteadiness on feet: Secondary | ICD-10-CM | POA: Diagnosis not present

## 2017-11-11 NOTE — Patient Outreach (Signed)
[place] [facility]) Care Management  11/11/2017  Courtney Gamble December 31, 1928 [id number]  Transition of care  Referral date:10/26/17 Referral source:discharged from [facility] on 10/20/17 /Transition of care.  Referral received from health team advantage stating patient has projected discharge date from skilled nursing facility on 11/10/17 Insurance:Health team advantage    Telephone call to patient for transition of care follow up. Unable to reach patient or leave voice message.  Message states enter remote access code.  PLAN:  RNCM will attempt 2nd telephone call to patient within 3 business days.   Quinn Plowman RN,BSN,CCM [facility] Telephonic  [phone]

## 2017-11-12 ENCOUNTER — Other Ambulatory Visit: Payer: Self-pay

## 2017-11-12 DIAGNOSIS — K5900 Constipation, unspecified: Secondary | ICD-10-CM | POA: Diagnosis not present

## 2017-11-12 DIAGNOSIS — F411 Generalized anxiety disorder: Secondary | ICD-10-CM | POA: Diagnosis not present

## 2017-11-12 DIAGNOSIS — F0280 Dementia in other diseases classified elsewhere without behavioral disturbance: Secondary | ICD-10-CM | POA: Diagnosis not present

## 2017-11-12 NOTE — Patient Outreach (Signed)
[place] [facility]) Care Management  11/12/2017  LEOLIA VINZANT 1929-06-06 [id number]   Transition of care  Referral date:10/26/17 Referral source:discharged from [facility] on 10/20/17 /Transition of care.  Referral received from health team advantage stating patient has projected discharge date from skilled nursing facility on 11/10/17 Insurance:Health team advantage Attempt #2  Telephone call to patients listed home number.  Unable to reach patient or leave voice message. Phone only rang. Attempted to reach patient at alternate contact phone number.   HIPAA compliant message left with call back phone number.   PLAN:; RNCM will attempt 3rd telephone outreach within 3 business days.   Quinn Plowman RN,BSN,CCM [facility] Telephonic  [phone]

## 2017-11-14 DIAGNOSIS — Z79899 Other long term (current) drug therapy: Secondary | ICD-10-CM | POA: Diagnosis not present

## 2017-11-16 ENCOUNTER — Other Ambulatory Visit: Payer: Self-pay

## 2017-11-16 NOTE — Patient Outreach (Signed)
[facility]) Care Management  11/16/2017  JENNIER SCHISSLER 02/20/1929 [id number]  Transition of care  Referral date:10/26/17 Referral source:discharged from [facility] on 10/20/17/Transition of care. Referral received from health team advantage stating patient has projected discharge date from skilled nursing facility on 11/10/17 Insurance:Health team advantage Attempt #3  Telephone call to patient for transition of care follow up. Unable to reach patient or leave voice message.  Message states enter remote access code.  PLAN:  RNCM will send patient outreach letter to attempt contact.   Quinn Plowman RN,BSN,CCM [facility] Telephonic  [phone]

## 2017-11-20 DIAGNOSIS — N390 Urinary tract infection, site not specified: Secondary | ICD-10-CM | POA: Diagnosis not present

## 2017-11-26 DIAGNOSIS — M80051D Age-related osteoporosis with current pathological fracture, right femur, subsequent encounter for fracture with routine healing: Secondary | ICD-10-CM | POA: Diagnosis not present

## 2017-11-26 DIAGNOSIS — I2510 Atherosclerotic heart disease of native coronary artery without angina pectoris: Secondary | ICD-10-CM | POA: Diagnosis not present

## 2017-11-26 DIAGNOSIS — Z7982 Long term (current) use of aspirin: Secondary | ICD-10-CM | POA: Diagnosis not present

## 2017-11-26 DIAGNOSIS — G309 Alzheimer's disease, unspecified: Secondary | ICD-10-CM | POA: Diagnosis not present

## 2017-11-26 DIAGNOSIS — I10 Essential (primary) hypertension: Secondary | ICD-10-CM | POA: Diagnosis not present

## 2017-11-26 DIAGNOSIS — Z9181 History of falling: Secondary | ICD-10-CM | POA: Diagnosis not present

## 2017-11-26 DIAGNOSIS — F0280 Dementia in other diseases classified elsewhere without behavioral disturbance: Secondary | ICD-10-CM | POA: Diagnosis not present

## 2017-11-26 DIAGNOSIS — R339 Retention of urine, unspecified: Secondary | ICD-10-CM | POA: Diagnosis not present

## 2017-11-26 DIAGNOSIS — Z96641 Presence of right artificial hip joint: Secondary | ICD-10-CM | POA: Diagnosis not present

## 2017-11-26 DIAGNOSIS — F411 Generalized anxiety disorder: Secondary | ICD-10-CM | POA: Diagnosis not present

## 2017-11-26 DIAGNOSIS — Z955 Presence of coronary angioplasty implant and graft: Secondary | ICD-10-CM | POA: Diagnosis not present

## 2017-11-30 DIAGNOSIS — M80051D Age-related osteoporosis with current pathological fracture, right femur, subsequent encounter for fracture with routine healing: Secondary | ICD-10-CM | POA: Diagnosis not present

## 2017-11-30 DIAGNOSIS — Z96641 Presence of right artificial hip joint: Secondary | ICD-10-CM | POA: Diagnosis not present

## 2017-11-30 DIAGNOSIS — Z96649 Presence of unspecified artificial hip joint: Secondary | ICD-10-CM | POA: Diagnosis not present

## 2017-11-30 DIAGNOSIS — M533 Sacrococcygeal disorders, not elsewhere classified: Secondary | ICD-10-CM | POA: Diagnosis not present

## 2017-12-01 DIAGNOSIS — R339 Retention of urine, unspecified: Secondary | ICD-10-CM | POA: Diagnosis not present

## 2017-12-01 DIAGNOSIS — N3010 Interstitial cystitis (chronic) without hematuria: Secondary | ICD-10-CM | POA: Diagnosis not present

## 2017-12-03 ENCOUNTER — Other Ambulatory Visit: Payer: Self-pay

## 2017-12-03 DIAGNOSIS — S3210XA Unspecified fracture of sacrum, initial encounter for closed fracture: Secondary | ICD-10-CM | POA: Diagnosis not present

## 2017-12-03 NOTE — Patient Outreach (Signed)
[place] [facility]) Care Management  12/03/2017  Courtney Gamble 1929-06-23 [id number]   Transition of care  Referral date:10/26/17 Referral source:discharged from [facility] on 10/20/17/Transition of care. Referral received from health team advantage stating patient has projected discharge date from skilled nursing facility on 11/10/17 Insurance:Health team advantage  No response from patient after several telephone attempts and outreach letter.   PLAN: RNCM will refer patient to care management assistant to close patient due to being unable to reach.  RNCM  Will send notification to primary MD of closure.   Quinn Plowman RN,BSN,CCM [facility] Telephonic  [phone]

## 2017-12-23 DIAGNOSIS — G308 Other Alzheimer's disease: Secondary | ICD-10-CM | POA: Diagnosis not present

## 2017-12-23 DIAGNOSIS — F0280 Dementia in other diseases classified elsewhere without behavioral disturbance: Secondary | ICD-10-CM | POA: Diagnosis not present

## 2017-12-23 DIAGNOSIS — F411 Generalized anxiety disorder: Secondary | ICD-10-CM | POA: Diagnosis not present

## 2017-12-28 DIAGNOSIS — M80051D Age-related osteoporosis with current pathological fracture, right femur, subsequent encounter for fracture with routine healing: Secondary | ICD-10-CM | POA: Diagnosis not present

## 2017-12-28 DIAGNOSIS — Z955 Presence of coronary angioplasty implant and graft: Secondary | ICD-10-CM | POA: Diagnosis not present

## 2017-12-28 DIAGNOSIS — R339 Retention of urine, unspecified: Secondary | ICD-10-CM | POA: Diagnosis not present

## 2017-12-28 DIAGNOSIS — I2510 Atherosclerotic heart disease of native coronary artery without angina pectoris: Secondary | ICD-10-CM | POA: Diagnosis not present

## 2017-12-28 DIAGNOSIS — Z9181 History of falling: Secondary | ICD-10-CM | POA: Diagnosis not present

## 2017-12-28 DIAGNOSIS — G309 Alzheimer's disease, unspecified: Secondary | ICD-10-CM | POA: Diagnosis not present

## 2017-12-28 DIAGNOSIS — I10 Essential (primary) hypertension: Secondary | ICD-10-CM | POA: Diagnosis not present

## 2017-12-28 DIAGNOSIS — F411 Generalized anxiety disorder: Secondary | ICD-10-CM | POA: Diagnosis not present

## 2017-12-28 DIAGNOSIS — F0280 Dementia in other diseases classified elsewhere without behavioral disturbance: Secondary | ICD-10-CM | POA: Diagnosis not present

## 2017-12-28 DIAGNOSIS — Z7982 Long term (current) use of aspirin: Secondary | ICD-10-CM | POA: Diagnosis not present

## 2017-12-28 DIAGNOSIS — Z96641 Presence of right artificial hip joint: Secondary | ICD-10-CM | POA: Diagnosis not present

## 2017-12-31 DIAGNOSIS — M6281 Muscle weakness (generalized): Secondary | ICD-10-CM | POA: Diagnosis not present

## 2017-12-31 DIAGNOSIS — I2510 Atherosclerotic heart disease of native coronary artery without angina pectoris: Secondary | ICD-10-CM | POA: Diagnosis not present

## 2017-12-31 DIAGNOSIS — K219 Gastro-esophageal reflux disease without esophagitis: Secondary | ICD-10-CM | POA: Diagnosis not present

## 2017-12-31 DIAGNOSIS — E559 Vitamin D deficiency, unspecified: Secondary | ICD-10-CM | POA: Diagnosis not present

## 2017-12-31 DIAGNOSIS — R339 Retention of urine, unspecified: Secondary | ICD-10-CM | POA: Diagnosis not present

## 2017-12-31 DIAGNOSIS — G8929 Other chronic pain: Secondary | ICD-10-CM | POA: Diagnosis not present

## 2017-12-31 DIAGNOSIS — F339 Major depressive disorder, recurrent, unspecified: Secondary | ICD-10-CM | POA: Diagnosis not present

## 2017-12-31 DIAGNOSIS — K5900 Constipation, unspecified: Secondary | ICD-10-CM | POA: Diagnosis not present

## 2017-12-31 DIAGNOSIS — E785 Hyperlipidemia, unspecified: Secondary | ICD-10-CM | POA: Diagnosis not present

## 2018-01-07 DIAGNOSIS — E119 Type 2 diabetes mellitus without complications: Secondary | ICD-10-CM | POA: Diagnosis not present

## 2018-01-07 DIAGNOSIS — E559 Vitamin D deficiency, unspecified: Secondary | ICD-10-CM | POA: Diagnosis not present

## 2018-01-07 DIAGNOSIS — R300 Dysuria: Secondary | ICD-10-CM | POA: Diagnosis not present

## 2018-01-07 DIAGNOSIS — Z96641 Presence of right artificial hip joint: Secondary | ICD-10-CM | POA: Diagnosis not present

## 2018-01-07 DIAGNOSIS — M85851 Other specified disorders of bone density and structure, right thigh: Secondary | ICD-10-CM | POA: Diagnosis not present

## 2018-01-07 DIAGNOSIS — E039 Hypothyroidism, unspecified: Secondary | ICD-10-CM | POA: Diagnosis not present

## 2018-01-07 DIAGNOSIS — G894 Chronic pain syndrome: Secondary | ICD-10-CM | POA: Diagnosis not present

## 2018-01-07 DIAGNOSIS — I10 Essential (primary) hypertension: Secondary | ICD-10-CM | POA: Diagnosis not present

## 2018-01-07 DIAGNOSIS — D518 Other vitamin B12 deficiency anemias: Secondary | ICD-10-CM | POA: Diagnosis not present

## 2018-01-21 DIAGNOSIS — D518 Other vitamin B12 deficiency anemias: Secondary | ICD-10-CM | POA: Diagnosis not present

## 2018-01-21 DIAGNOSIS — E559 Vitamin D deficiency, unspecified: Secondary | ICD-10-CM | POA: Diagnosis not present

## 2018-01-21 DIAGNOSIS — F419 Anxiety disorder, unspecified: Secondary | ICD-10-CM | POA: Diagnosis not present

## 2018-01-21 DIAGNOSIS — I10 Essential (primary) hypertension: Secondary | ICD-10-CM | POA: Diagnosis not present

## 2018-01-21 DIAGNOSIS — F331 Major depressive disorder, recurrent, moderate: Secondary | ICD-10-CM | POA: Diagnosis not present

## 2018-01-21 DIAGNOSIS — R300 Dysuria: Secondary | ICD-10-CM | POA: Diagnosis not present

## 2018-01-21 DIAGNOSIS — E119 Type 2 diabetes mellitus without complications: Secondary | ICD-10-CM | POA: Diagnosis not present

## 2018-01-21 DIAGNOSIS — E039 Hypothyroidism, unspecified: Secondary | ICD-10-CM | POA: Diagnosis not present

## 2018-01-25 DIAGNOSIS — F331 Major depressive disorder, recurrent, moderate: Secondary | ICD-10-CM | POA: Diagnosis not present

## 2018-01-25 DIAGNOSIS — F419 Anxiety disorder, unspecified: Secondary | ICD-10-CM | POA: Diagnosis not present

## 2018-02-01 DIAGNOSIS — F333 Major depressive disorder, recurrent, severe with psychotic symptoms: Secondary | ICD-10-CM | POA: Diagnosis not present

## 2018-02-01 DIAGNOSIS — G301 Alzheimer's disease with late onset: Secondary | ICD-10-CM | POA: Diagnosis not present

## 2018-02-01 DIAGNOSIS — F0280 Dementia in other diseases classified elsewhere without behavioral disturbance: Secondary | ICD-10-CM | POA: Diagnosis not present

## 2018-02-01 DIAGNOSIS — F064 Anxiety disorder due to known physiological condition: Secondary | ICD-10-CM | POA: Diagnosis not present

## 2018-02-15 DIAGNOSIS — F0280 Dementia in other diseases classified elsewhere without behavioral disturbance: Secondary | ICD-10-CM | POA: Diagnosis not present

## 2018-02-15 DIAGNOSIS — G301 Alzheimer's disease with late onset: Secondary | ICD-10-CM | POA: Diagnosis not present

## 2018-02-15 DIAGNOSIS — F333 Major depressive disorder, recurrent, severe with psychotic symptoms: Secondary | ICD-10-CM | POA: Diagnosis not present

## 2018-02-15 DIAGNOSIS — F064 Anxiety disorder due to known physiological condition: Secondary | ICD-10-CM | POA: Diagnosis not present

## 2018-02-17 DIAGNOSIS — G8929 Other chronic pain: Secondary | ICD-10-CM | POA: Diagnosis not present

## 2018-02-17 DIAGNOSIS — I2510 Atherosclerotic heart disease of native coronary artery without angina pectoris: Secondary | ICD-10-CM | POA: Diagnosis not present

## 2018-02-17 DIAGNOSIS — K219 Gastro-esophageal reflux disease without esophagitis: Secondary | ICD-10-CM | POA: Diagnosis not present

## 2018-02-17 DIAGNOSIS — R339 Retention of urine, unspecified: Secondary | ICD-10-CM | POA: Diagnosis not present

## 2018-02-17 DIAGNOSIS — E785 Hyperlipidemia, unspecified: Secondary | ICD-10-CM | POA: Diagnosis not present

## 2018-02-17 DIAGNOSIS — E559 Vitamin D deficiency, unspecified: Secondary | ICD-10-CM | POA: Diagnosis not present

## 2018-02-17 DIAGNOSIS — K5900 Constipation, unspecified: Secondary | ICD-10-CM | POA: Diagnosis not present

## 2018-02-17 DIAGNOSIS — F339 Major depressive disorder, recurrent, unspecified: Secondary | ICD-10-CM | POA: Diagnosis not present

## 2018-02-25 DIAGNOSIS — Z79899 Other long term (current) drug therapy: Secondary | ICD-10-CM | POA: Diagnosis not present

## 2018-03-01 DIAGNOSIS — G301 Alzheimer's disease with late onset: Secondary | ICD-10-CM | POA: Diagnosis not present

## 2018-03-01 DIAGNOSIS — F333 Major depressive disorder, recurrent, severe with psychotic symptoms: Secondary | ICD-10-CM | POA: Diagnosis not present

## 2018-03-01 DIAGNOSIS — F0280 Dementia in other diseases classified elsewhere without behavioral disturbance: Secondary | ICD-10-CM | POA: Diagnosis not present

## 2018-03-01 DIAGNOSIS — F064 Anxiety disorder due to known physiological condition: Secondary | ICD-10-CM | POA: Diagnosis not present

## 2018-03-11 DIAGNOSIS — K5900 Constipation, unspecified: Secondary | ICD-10-CM | POA: Diagnosis not present

## 2018-03-11 DIAGNOSIS — E559 Vitamin D deficiency, unspecified: Secondary | ICD-10-CM | POA: Diagnosis not present

## 2018-03-11 DIAGNOSIS — G8929 Other chronic pain: Secondary | ICD-10-CM | POA: Diagnosis not present

## 2018-03-11 DIAGNOSIS — K219 Gastro-esophageal reflux disease without esophagitis: Secondary | ICD-10-CM | POA: Diagnosis not present

## 2018-03-11 DIAGNOSIS — F339 Major depressive disorder, recurrent, unspecified: Secondary | ICD-10-CM | POA: Diagnosis not present

## 2018-03-11 DIAGNOSIS — E785 Hyperlipidemia, unspecified: Secondary | ICD-10-CM | POA: Diagnosis not present

## 2018-03-11 DIAGNOSIS — R339 Retention of urine, unspecified: Secondary | ICD-10-CM | POA: Diagnosis not present

## 2018-03-11 DIAGNOSIS — M6281 Muscle weakness (generalized): Secondary | ICD-10-CM | POA: Diagnosis not present

## 2018-03-11 DIAGNOSIS — I2510 Atherosclerotic heart disease of native coronary artery without angina pectoris: Secondary | ICD-10-CM | POA: Diagnosis not present

## 2018-03-15 DIAGNOSIS — F064 Anxiety disorder due to known physiological condition: Secondary | ICD-10-CM | POA: Diagnosis not present

## 2018-03-15 DIAGNOSIS — F0280 Dementia in other diseases classified elsewhere without behavioral disturbance: Secondary | ICD-10-CM | POA: Diagnosis not present

## 2018-03-15 DIAGNOSIS — G301 Alzheimer's disease with late onset: Secondary | ICD-10-CM | POA: Diagnosis not present

## 2018-03-15 DIAGNOSIS — F333 Major depressive disorder, recurrent, severe with psychotic symptoms: Secondary | ICD-10-CM | POA: Diagnosis not present

## 2018-03-25 DIAGNOSIS — R339 Retention of urine, unspecified: Secondary | ICD-10-CM | POA: Diagnosis not present

## 2018-03-25 DIAGNOSIS — I2510 Atherosclerotic heart disease of native coronary artery without angina pectoris: Secondary | ICD-10-CM | POA: Diagnosis not present

## 2018-03-25 DIAGNOSIS — G8929 Other chronic pain: Secondary | ICD-10-CM | POA: Diagnosis not present

## 2018-03-25 DIAGNOSIS — M6281 Muscle weakness (generalized): Secondary | ICD-10-CM | POA: Diagnosis not present

## 2018-03-25 DIAGNOSIS — K5900 Constipation, unspecified: Secondary | ICD-10-CM | POA: Diagnosis not present

## 2018-03-25 DIAGNOSIS — K219 Gastro-esophageal reflux disease without esophagitis: Secondary | ICD-10-CM | POA: Diagnosis not present

## 2018-03-25 DIAGNOSIS — F339 Major depressive disorder, recurrent, unspecified: Secondary | ICD-10-CM | POA: Diagnosis not present

## 2018-03-25 DIAGNOSIS — E785 Hyperlipidemia, unspecified: Secondary | ICD-10-CM | POA: Diagnosis not present

## 2018-03-25 DIAGNOSIS — E559 Vitamin D deficiency, unspecified: Secondary | ICD-10-CM | POA: Diagnosis not present

## 2018-03-29 DIAGNOSIS — G301 Alzheimer's disease with late onset: Secondary | ICD-10-CM | POA: Diagnosis not present

## 2018-03-29 DIAGNOSIS — F0280 Dementia in other diseases classified elsewhere without behavioral disturbance: Secondary | ICD-10-CM | POA: Diagnosis not present

## 2018-03-29 DIAGNOSIS — F333 Major depressive disorder, recurrent, severe with psychotic symptoms: Secondary | ICD-10-CM | POA: Diagnosis not present

## 2018-03-29 DIAGNOSIS — F064 Anxiety disorder due to known physiological condition: Secondary | ICD-10-CM | POA: Diagnosis not present

## 2018-04-05 DIAGNOSIS — R300 Dysuria: Secondary | ICD-10-CM | POA: Diagnosis not present

## 2018-04-08 DIAGNOSIS — M25512 Pain in left shoulder: Secondary | ICD-10-CM | POA: Diagnosis not present

## 2018-04-08 DIAGNOSIS — Z96641 Presence of right artificial hip joint: Secondary | ICD-10-CM | POA: Diagnosis not present

## 2018-04-08 DIAGNOSIS — M25552 Pain in left hip: Secondary | ICD-10-CM | POA: Diagnosis not present

## 2018-04-08 DIAGNOSIS — Z96649 Presence of unspecified artificial hip joint: Secondary | ICD-10-CM | POA: Diagnosis not present

## 2018-04-08 DIAGNOSIS — M80051D Age-related osteoporosis with current pathological fracture, right femur, subsequent encounter for fracture with routine healing: Secondary | ICD-10-CM | POA: Diagnosis not present

## 2018-04-08 DIAGNOSIS — M19012 Primary osteoarthritis, left shoulder: Secondary | ICD-10-CM | POA: Diagnosis not present

## 2018-04-12 DIAGNOSIS — F333 Major depressive disorder, recurrent, severe with psychotic symptoms: Secondary | ICD-10-CM | POA: Diagnosis not present

## 2018-04-12 DIAGNOSIS — F0280 Dementia in other diseases classified elsewhere without behavioral disturbance: Secondary | ICD-10-CM | POA: Diagnosis not present

## 2018-04-12 DIAGNOSIS — G301 Alzheimer's disease with late onset: Secondary | ICD-10-CM | POA: Diagnosis not present

## 2018-04-12 DIAGNOSIS — F064 Anxiety disorder due to known physiological condition: Secondary | ICD-10-CM | POA: Diagnosis not present

## 2018-04-14 DIAGNOSIS — I10 Essential (primary) hypertension: Secondary | ICD-10-CM | POA: Diagnosis not present

## 2018-04-14 DIAGNOSIS — R41840 Attention and concentration deficit: Secondary | ICD-10-CM | POA: Diagnosis not present

## 2018-04-14 DIAGNOSIS — G309 Alzheimer's disease, unspecified: Secondary | ICD-10-CM | POA: Diagnosis not present

## 2018-04-14 DIAGNOSIS — M6281 Muscle weakness (generalized): Secondary | ICD-10-CM | POA: Diagnosis not present

## 2018-04-18 DIAGNOSIS — R0781 Pleurodynia: Secondary | ICD-10-CM | POA: Diagnosis not present

## 2018-04-18 DIAGNOSIS — S299XXA Unspecified injury of thorax, initial encounter: Secondary | ICD-10-CM | POA: Diagnosis not present

## 2018-04-18 DIAGNOSIS — G8929 Other chronic pain: Secondary | ICD-10-CM | POA: Diagnosis not present

## 2018-04-18 DIAGNOSIS — S20211A Contusion of right front wall of thorax, initial encounter: Secondary | ICD-10-CM | POA: Diagnosis not present

## 2018-04-18 DIAGNOSIS — M25512 Pain in left shoulder: Secondary | ICD-10-CM | POA: Diagnosis not present

## 2018-04-18 DIAGNOSIS — S4992XA Unspecified injury of left shoulder and upper arm, initial encounter: Secondary | ICD-10-CM | POA: Diagnosis not present

## 2018-04-18 DIAGNOSIS — W19XXXA Unspecified fall, initial encounter: Secondary | ICD-10-CM | POA: Diagnosis not present

## 2018-04-26 DIAGNOSIS — F064 Anxiety disorder due to known physiological condition: Secondary | ICD-10-CM | POA: Diagnosis not present

## 2018-04-26 DIAGNOSIS — F0280 Dementia in other diseases classified elsewhere without behavioral disturbance: Secondary | ICD-10-CM | POA: Diagnosis not present

## 2018-04-26 DIAGNOSIS — F329 Major depressive disorder, single episode, unspecified: Secondary | ICD-10-CM | POA: Diagnosis not present

## 2018-04-26 DIAGNOSIS — M19012 Primary osteoarthritis, left shoulder: Secondary | ICD-10-CM | POA: Diagnosis not present

## 2018-04-26 DIAGNOSIS — F419 Anxiety disorder, unspecified: Secondary | ICD-10-CM | POA: Diagnosis not present

## 2018-04-26 DIAGNOSIS — Z87311 Personal history of (healed) other pathological fracture: Secondary | ICD-10-CM | POA: Diagnosis not present

## 2018-04-26 DIAGNOSIS — F331 Major depressive disorder, recurrent, moderate: Secondary | ICD-10-CM | POA: Diagnosis not present

## 2018-04-26 DIAGNOSIS — M810 Age-related osteoporosis without current pathological fracture: Secondary | ICD-10-CM | POA: Diagnosis not present

## 2018-04-26 DIAGNOSIS — F333 Major depressive disorder, recurrent, severe with psychotic symptoms: Secondary | ICD-10-CM | POA: Diagnosis not present

## 2018-04-26 DIAGNOSIS — G301 Alzheimer's disease with late onset: Secondary | ICD-10-CM | POA: Diagnosis not present

## 2018-04-26 DIAGNOSIS — Z96641 Presence of right artificial hip joint: Secondary | ICD-10-CM | POA: Diagnosis not present

## 2018-04-26 DIAGNOSIS — Z7982 Long term (current) use of aspirin: Secondary | ICD-10-CM | POA: Diagnosis not present

## 2018-04-26 DIAGNOSIS — M1612 Unilateral primary osteoarthritis, left hip: Secondary | ICD-10-CM | POA: Diagnosis not present

## 2018-04-26 DIAGNOSIS — M25551 Pain in right hip: Secondary | ICD-10-CM | POA: Diagnosis not present

## 2018-04-26 DIAGNOSIS — G309 Alzheimer's disease, unspecified: Secondary | ICD-10-CM | POA: Diagnosis not present

## 2018-04-26 DIAGNOSIS — Z9181 History of falling: Secondary | ICD-10-CM | POA: Diagnosis not present

## 2018-04-26 DIAGNOSIS — R634 Abnormal weight loss: Secondary | ICD-10-CM | POA: Diagnosis not present

## 2018-04-27 DIAGNOSIS — Z79899 Other long term (current) drug therapy: Secondary | ICD-10-CM | POA: Diagnosis not present

## 2018-04-29 DIAGNOSIS — G894 Chronic pain syndrome: Secondary | ICD-10-CM | POA: Diagnosis not present

## 2018-05-10 DIAGNOSIS — F333 Major depressive disorder, recurrent, severe with psychotic symptoms: Secondary | ICD-10-CM | POA: Diagnosis not present

## 2018-05-10 DIAGNOSIS — F419 Anxiety disorder, unspecified: Secondary | ICD-10-CM | POA: Diagnosis not present

## 2018-05-10 DIAGNOSIS — G301 Alzheimer's disease with late onset: Secondary | ICD-10-CM | POA: Diagnosis not present

## 2018-05-10 DIAGNOSIS — F064 Anxiety disorder due to known physiological condition: Secondary | ICD-10-CM | POA: Diagnosis not present

## 2018-05-10 DIAGNOSIS — F0280 Dementia in other diseases classified elsewhere without behavioral disturbance: Secondary | ICD-10-CM | POA: Diagnosis not present

## 2018-05-10 DIAGNOSIS — F331 Major depressive disorder, recurrent, moderate: Secondary | ICD-10-CM | POA: Diagnosis not present

## 2018-05-24 DIAGNOSIS — G301 Alzheimer's disease with late onset: Secondary | ICD-10-CM | POA: Diagnosis not present

## 2018-05-24 DIAGNOSIS — F331 Major depressive disorder, recurrent, moderate: Secondary | ICD-10-CM | POA: Diagnosis not present

## 2018-05-24 DIAGNOSIS — F5105 Insomnia due to other mental disorder: Secondary | ICD-10-CM | POA: Diagnosis not present

## 2018-05-24 DIAGNOSIS — F0280 Dementia in other diseases classified elsewhere without behavioral disturbance: Secondary | ICD-10-CM | POA: Diagnosis not present

## 2018-05-24 DIAGNOSIS — F064 Anxiety disorder due to known physiological condition: Secondary | ICD-10-CM | POA: Diagnosis not present

## 2018-05-27 DIAGNOSIS — G894 Chronic pain syndrome: Secondary | ICD-10-CM | POA: Diagnosis not present

## 2018-05-31 DIAGNOSIS — F419 Anxiety disorder, unspecified: Secondary | ICD-10-CM | POA: Diagnosis not present

## 2018-05-31 DIAGNOSIS — F331 Major depressive disorder, recurrent, moderate: Secondary | ICD-10-CM | POA: Diagnosis not present

## 2018-06-07 DIAGNOSIS — F331 Major depressive disorder, recurrent, moderate: Secondary | ICD-10-CM | POA: Diagnosis not present

## 2018-06-07 DIAGNOSIS — F0280 Dementia in other diseases classified elsewhere without behavioral disturbance: Secondary | ICD-10-CM | POA: Diagnosis not present

## 2018-06-07 DIAGNOSIS — G301 Alzheimer's disease with late onset: Secondary | ICD-10-CM | POA: Diagnosis not present

## 2018-06-07 DIAGNOSIS — F064 Anxiety disorder due to known physiological condition: Secondary | ICD-10-CM | POA: Diagnosis not present

## 2018-06-07 DIAGNOSIS — F5105 Insomnia due to other mental disorder: Secondary | ICD-10-CM | POA: Diagnosis not present

## 2018-06-10 DIAGNOSIS — K219 Gastro-esophageal reflux disease without esophagitis: Secondary | ICD-10-CM | POA: Diagnosis not present

## 2018-06-10 DIAGNOSIS — E559 Vitamin D deficiency, unspecified: Secondary | ICD-10-CM | POA: Diagnosis not present

## 2018-06-10 DIAGNOSIS — K5900 Constipation, unspecified: Secondary | ICD-10-CM | POA: Diagnosis not present

## 2018-06-10 DIAGNOSIS — I2510 Atherosclerotic heart disease of native coronary artery without angina pectoris: Secondary | ICD-10-CM | POA: Diagnosis not present

## 2018-06-10 DIAGNOSIS — E785 Hyperlipidemia, unspecified: Secondary | ICD-10-CM | POA: Diagnosis not present

## 2018-06-10 DIAGNOSIS — G8929 Other chronic pain: Secondary | ICD-10-CM | POA: Diagnosis not present

## 2018-06-10 DIAGNOSIS — F339 Major depressive disorder, recurrent, unspecified: Secondary | ICD-10-CM | POA: Diagnosis not present

## 2018-06-10 DIAGNOSIS — R339 Retention of urine, unspecified: Secondary | ICD-10-CM | POA: Diagnosis not present

## 2018-06-10 DIAGNOSIS — M6281 Muscle weakness (generalized): Secondary | ICD-10-CM | POA: Diagnosis not present

## 2018-06-17 DIAGNOSIS — G894 Chronic pain syndrome: Secondary | ICD-10-CM | POA: Diagnosis not present

## 2018-06-21 DIAGNOSIS — G301 Alzheimer's disease with late onset: Secondary | ICD-10-CM | POA: Diagnosis not present

## 2018-06-21 DIAGNOSIS — F064 Anxiety disorder due to known physiological condition: Secondary | ICD-10-CM | POA: Diagnosis not present

## 2018-06-21 DIAGNOSIS — F331 Major depressive disorder, recurrent, moderate: Secondary | ICD-10-CM | POA: Diagnosis not present

## 2018-06-21 DIAGNOSIS — F0280 Dementia in other diseases classified elsewhere without behavioral disturbance: Secondary | ICD-10-CM | POA: Diagnosis not present

## 2018-06-21 DIAGNOSIS — F5105 Insomnia due to other mental disorder: Secondary | ICD-10-CM | POA: Diagnosis not present

## 2018-06-24 DIAGNOSIS — R2689 Other abnormalities of gait and mobility: Secondary | ICD-10-CM | POA: Diagnosis not present

## 2018-06-24 DIAGNOSIS — R296 Repeated falls: Secondary | ICD-10-CM | POA: Diagnosis not present

## 2018-06-24 DIAGNOSIS — S50812A Abrasion of left forearm, initial encounter: Secondary | ICD-10-CM | POA: Diagnosis not present

## 2018-07-05 DIAGNOSIS — F5105 Insomnia due to other mental disorder: Secondary | ICD-10-CM | POA: Diagnosis not present

## 2018-07-05 DIAGNOSIS — F0280 Dementia in other diseases classified elsewhere without behavioral disturbance: Secondary | ICD-10-CM | POA: Diagnosis not present

## 2018-07-05 DIAGNOSIS — F064 Anxiety disorder due to known physiological condition: Secondary | ICD-10-CM | POA: Diagnosis not present

## 2018-07-05 DIAGNOSIS — F331 Major depressive disorder, recurrent, moderate: Secondary | ICD-10-CM | POA: Diagnosis not present

## 2018-07-05 DIAGNOSIS — G301 Alzheimer's disease with late onset: Secondary | ICD-10-CM | POA: Diagnosis not present

## 2018-07-08 DIAGNOSIS — M533 Sacrococcygeal disorders, not elsewhere classified: Secondary | ICD-10-CM | POA: Diagnosis not present

## 2018-07-08 DIAGNOSIS — G8929 Other chronic pain: Secondary | ICD-10-CM | POA: Diagnosis not present

## 2018-07-08 DIAGNOSIS — R296 Repeated falls: Secondary | ICD-10-CM | POA: Diagnosis not present

## 2018-07-08 DIAGNOSIS — F0280 Dementia in other diseases classified elsewhere without behavioral disturbance: Secondary | ICD-10-CM | POA: Diagnosis not present

## 2018-07-08 DIAGNOSIS — M25551 Pain in right hip: Secondary | ICD-10-CM | POA: Diagnosis not present

## 2018-07-19 DIAGNOSIS — F5105 Insomnia due to other mental disorder: Secondary | ICD-10-CM | POA: Diagnosis not present

## 2018-07-19 DIAGNOSIS — F331 Major depressive disorder, recurrent, moderate: Secondary | ICD-10-CM | POA: Diagnosis not present

## 2018-07-19 DIAGNOSIS — F064 Anxiety disorder due to known physiological condition: Secondary | ICD-10-CM | POA: Diagnosis not present

## 2018-07-19 DIAGNOSIS — G301 Alzheimer's disease with late onset: Secondary | ICD-10-CM | POA: Diagnosis not present

## 2018-07-19 DIAGNOSIS — F0280 Dementia in other diseases classified elsewhere without behavioral disturbance: Secondary | ICD-10-CM | POA: Diagnosis not present

## 2018-07-21 DIAGNOSIS — G8929 Other chronic pain: Secondary | ICD-10-CM | POA: Diagnosis not present

## 2018-07-21 DIAGNOSIS — M6281 Muscle weakness (generalized): Secondary | ICD-10-CM | POA: Diagnosis not present

## 2018-07-21 DIAGNOSIS — K5900 Constipation, unspecified: Secondary | ICD-10-CM | POA: Diagnosis not present

## 2018-07-21 DIAGNOSIS — I2510 Atherosclerotic heart disease of native coronary artery without angina pectoris: Secondary | ICD-10-CM | POA: Diagnosis not present

## 2018-07-21 DIAGNOSIS — E559 Vitamin D deficiency, unspecified: Secondary | ICD-10-CM | POA: Diagnosis not present

## 2018-07-21 DIAGNOSIS — F339 Major depressive disorder, recurrent, unspecified: Secondary | ICD-10-CM | POA: Diagnosis not present

## 2018-07-21 DIAGNOSIS — E785 Hyperlipidemia, unspecified: Secondary | ICD-10-CM | POA: Diagnosis not present

## 2018-07-21 DIAGNOSIS — R339 Retention of urine, unspecified: Secondary | ICD-10-CM | POA: Diagnosis not present

## 2018-07-21 DIAGNOSIS — K219 Gastro-esophageal reflux disease without esophagitis: Secondary | ICD-10-CM | POA: Diagnosis not present

## 2018-07-22 DIAGNOSIS — G894 Chronic pain syndrome: Secondary | ICD-10-CM | POA: Diagnosis not present

## 2018-07-23 DIAGNOSIS — Z79899 Other long term (current) drug therapy: Secondary | ICD-10-CM | POA: Diagnosis not present

## 2018-08-11 DIAGNOSIS — W1839XA Other fall on same level, initial encounter: Secondary | ICD-10-CM | POA: Diagnosis not present

## 2018-08-11 DIAGNOSIS — N390 Urinary tract infection, site not specified: Secondary | ICD-10-CM | POA: Diagnosis not present

## 2018-08-11 DIAGNOSIS — M6281 Muscle weakness (generalized): Secondary | ICD-10-CM | POA: Diagnosis not present

## 2018-08-11 DIAGNOSIS — R300 Dysuria: Secondary | ICD-10-CM | POA: Diagnosis not present

## 2018-08-16 DIAGNOSIS — F064 Anxiety disorder due to known physiological condition: Secondary | ICD-10-CM | POA: Diagnosis not present

## 2018-08-16 DIAGNOSIS — G301 Alzheimer's disease with late onset: Secondary | ICD-10-CM | POA: Diagnosis not present

## 2018-08-16 DIAGNOSIS — F5105 Insomnia due to other mental disorder: Secondary | ICD-10-CM | POA: Diagnosis not present

## 2018-08-16 DIAGNOSIS — F0280 Dementia in other diseases classified elsewhere without behavioral disturbance: Secondary | ICD-10-CM | POA: Diagnosis not present

## 2018-08-16 DIAGNOSIS — F331 Major depressive disorder, recurrent, moderate: Secondary | ICD-10-CM | POA: Diagnosis not present

## 2018-09-01 DIAGNOSIS — N390 Urinary tract infection, site not specified: Secondary | ICD-10-CM | POA: Diagnosis not present

## 2018-09-04 DIAGNOSIS — R350 Frequency of micturition: Secondary | ICD-10-CM | POA: Diagnosis not present

## 2018-09-09 DIAGNOSIS — Z79899 Other long term (current) drug therapy: Secondary | ICD-10-CM | POA: Diagnosis not present

## 2018-09-13 DIAGNOSIS — F5105 Insomnia due to other mental disorder: Secondary | ICD-10-CM | POA: Diagnosis not present

## 2018-09-13 DIAGNOSIS — F0280 Dementia in other diseases classified elsewhere without behavioral disturbance: Secondary | ICD-10-CM | POA: Diagnosis not present

## 2018-09-13 DIAGNOSIS — G301 Alzheimer's disease with late onset: Secondary | ICD-10-CM | POA: Diagnosis not present

## 2018-09-13 DIAGNOSIS — F331 Major depressive disorder, recurrent, moderate: Secondary | ICD-10-CM | POA: Diagnosis not present

## 2018-09-13 DIAGNOSIS — F064 Anxiety disorder due to known physiological condition: Secondary | ICD-10-CM | POA: Diagnosis not present

## 2018-09-15 DIAGNOSIS — R296 Repeated falls: Secondary | ICD-10-CM | POA: Diagnosis not present

## 2018-09-15 DIAGNOSIS — R4182 Altered mental status, unspecified: Secondary | ICD-10-CM | POA: Diagnosis not present

## 2018-09-17 DIAGNOSIS — R4182 Altered mental status, unspecified: Secondary | ICD-10-CM | POA: Diagnosis not present

## 2018-09-21 DIAGNOSIS — R300 Dysuria: Secondary | ICD-10-CM | POA: Diagnosis not present

## 2018-09-22 DIAGNOSIS — G8929 Other chronic pain: Secondary | ICD-10-CM | POA: Diagnosis not present

## 2018-09-22 DIAGNOSIS — G894 Chronic pain syndrome: Secondary | ICD-10-CM | POA: Diagnosis not present

## 2018-09-27 DIAGNOSIS — F064 Anxiety disorder due to known physiological condition: Secondary | ICD-10-CM | POA: Diagnosis not present

## 2018-09-27 DIAGNOSIS — G301 Alzheimer's disease with late onset: Secondary | ICD-10-CM | POA: Diagnosis not present

## 2018-09-27 DIAGNOSIS — F5105 Insomnia due to other mental disorder: Secondary | ICD-10-CM | POA: Diagnosis not present

## 2018-09-27 DIAGNOSIS — F0280 Dementia in other diseases classified elsewhere without behavioral disturbance: Secondary | ICD-10-CM | POA: Diagnosis not present

## 2018-09-27 DIAGNOSIS — F331 Major depressive disorder, recurrent, moderate: Secondary | ICD-10-CM | POA: Diagnosis not present

## 2018-10-11 DIAGNOSIS — F064 Anxiety disorder due to known physiological condition: Secondary | ICD-10-CM | POA: Diagnosis not present

## 2018-10-11 DIAGNOSIS — F5105 Insomnia due to other mental disorder: Secondary | ICD-10-CM | POA: Diagnosis not present

## 2018-10-11 DIAGNOSIS — G301 Alzheimer's disease with late onset: Secondary | ICD-10-CM | POA: Diagnosis not present

## 2018-10-11 DIAGNOSIS — F331 Major depressive disorder, recurrent, moderate: Secondary | ICD-10-CM | POA: Diagnosis not present

## 2018-10-11 DIAGNOSIS — F0280 Dementia in other diseases classified elsewhere without behavioral disturbance: Secondary | ICD-10-CM | POA: Diagnosis not present

## 2018-10-13 DIAGNOSIS — G894 Chronic pain syndrome: Secondary | ICD-10-CM | POA: Diagnosis not present

## 2018-10-13 DIAGNOSIS — G8929 Other chronic pain: Secondary | ICD-10-CM | POA: Diagnosis not present

## 2018-10-27 DIAGNOSIS — I2510 Atherosclerotic heart disease of native coronary artery without angina pectoris: Secondary | ICD-10-CM | POA: Diagnosis not present

## 2018-10-27 DIAGNOSIS — R296 Repeated falls: Secondary | ICD-10-CM | POA: Diagnosis not present

## 2018-10-27 DIAGNOSIS — R339 Retention of urine, unspecified: Secondary | ICD-10-CM | POA: Diagnosis not present

## 2018-10-27 DIAGNOSIS — M6281 Muscle weakness (generalized): Secondary | ICD-10-CM | POA: Diagnosis not present

## 2018-10-27 DIAGNOSIS — G8929 Other chronic pain: Secondary | ICD-10-CM | POA: Diagnosis not present

## 2018-10-27 DIAGNOSIS — F339 Major depressive disorder, recurrent, unspecified: Secondary | ICD-10-CM | POA: Diagnosis not present

## 2018-10-27 DIAGNOSIS — E559 Vitamin D deficiency, unspecified: Secondary | ICD-10-CM | POA: Diagnosis not present

## 2018-10-27 DIAGNOSIS — K5900 Constipation, unspecified: Secondary | ICD-10-CM | POA: Diagnosis not present

## 2018-10-27 DIAGNOSIS — K219 Gastro-esophageal reflux disease without esophagitis: Secondary | ICD-10-CM | POA: Diagnosis not present

## 2018-10-27 DIAGNOSIS — E785 Hyperlipidemia, unspecified: Secondary | ICD-10-CM | POA: Diagnosis not present

## 2018-11-08 DIAGNOSIS — F064 Anxiety disorder due to known physiological condition: Secondary | ICD-10-CM | POA: Diagnosis not present

## 2018-11-08 DIAGNOSIS — F331 Major depressive disorder, recurrent, moderate: Secondary | ICD-10-CM | POA: Diagnosis not present

## 2018-11-08 DIAGNOSIS — F0280 Dementia in other diseases classified elsewhere without behavioral disturbance: Secondary | ICD-10-CM | POA: Diagnosis not present

## 2018-11-08 DIAGNOSIS — F5105 Insomnia due to other mental disorder: Secondary | ICD-10-CM | POA: Diagnosis not present

## 2018-11-08 DIAGNOSIS — G301 Alzheimer's disease with late onset: Secondary | ICD-10-CM | POA: Diagnosis not present

## 2018-11-10 DIAGNOSIS — G894 Chronic pain syndrome: Secondary | ICD-10-CM | POA: Diagnosis not present

## 2018-11-10 DIAGNOSIS — G8929 Other chronic pain: Secondary | ICD-10-CM | POA: Diagnosis not present

## 2018-11-22 DIAGNOSIS — F0280 Dementia in other diseases classified elsewhere without behavioral disturbance: Secondary | ICD-10-CM | POA: Diagnosis not present

## 2018-11-22 DIAGNOSIS — F064 Anxiety disorder due to known physiological condition: Secondary | ICD-10-CM | POA: Diagnosis not present

## 2018-11-22 DIAGNOSIS — F331 Major depressive disorder, recurrent, moderate: Secondary | ICD-10-CM | POA: Diagnosis not present

## 2018-11-22 DIAGNOSIS — F5105 Insomnia due to other mental disorder: Secondary | ICD-10-CM | POA: Diagnosis not present

## 2018-11-22 DIAGNOSIS — G301 Alzheimer's disease with late onset: Secondary | ICD-10-CM | POA: Diagnosis not present

## 2018-11-23 DIAGNOSIS — F339 Major depressive disorder, recurrent, unspecified: Secondary | ICD-10-CM | POA: Diagnosis not present

## 2018-11-23 DIAGNOSIS — G8929 Other chronic pain: Secondary | ICD-10-CM | POA: Diagnosis not present

## 2018-11-23 DIAGNOSIS — I2510 Atherosclerotic heart disease of native coronary artery without angina pectoris: Secondary | ICD-10-CM | POA: Diagnosis not present

## 2018-11-23 DIAGNOSIS — E785 Hyperlipidemia, unspecified: Secondary | ICD-10-CM | POA: Diagnosis not present

## 2018-12-01 DIAGNOSIS — K219 Gastro-esophageal reflux disease without esophagitis: Secondary | ICD-10-CM | POA: Diagnosis not present

## 2018-12-01 DIAGNOSIS — R296 Repeated falls: Secondary | ICD-10-CM | POA: Diagnosis not present

## 2018-12-01 DIAGNOSIS — F339 Major depressive disorder, recurrent, unspecified: Secondary | ICD-10-CM | POA: Diagnosis not present

## 2018-12-01 DIAGNOSIS — K5900 Constipation, unspecified: Secondary | ICD-10-CM | POA: Diagnosis not present

## 2018-12-01 DIAGNOSIS — R339 Retention of urine, unspecified: Secondary | ICD-10-CM | POA: Diagnosis not present

## 2018-12-01 DIAGNOSIS — I2510 Atherosclerotic heart disease of native coronary artery without angina pectoris: Secondary | ICD-10-CM | POA: Diagnosis not present

## 2018-12-01 DIAGNOSIS — E785 Hyperlipidemia, unspecified: Secondary | ICD-10-CM | POA: Diagnosis not present

## 2018-12-01 DIAGNOSIS — E559 Vitamin D deficiency, unspecified: Secondary | ICD-10-CM | POA: Diagnosis not present

## 2018-12-01 DIAGNOSIS — G8929 Other chronic pain: Secondary | ICD-10-CM | POA: Diagnosis not present

## 2018-12-01 DIAGNOSIS — M6281 Muscle weakness (generalized): Secondary | ICD-10-CM | POA: Diagnosis not present

## 2018-12-15 DIAGNOSIS — G8929 Other chronic pain: Secondary | ICD-10-CM | POA: Diagnosis not present

## 2018-12-15 DIAGNOSIS — G894 Chronic pain syndrome: Secondary | ICD-10-CM | POA: Diagnosis not present

## 2018-12-20 DIAGNOSIS — G301 Alzheimer's disease with late onset: Secondary | ICD-10-CM | POA: Diagnosis not present

## 2018-12-20 DIAGNOSIS — F5105 Insomnia due to other mental disorder: Secondary | ICD-10-CM | POA: Diagnosis not present

## 2018-12-20 DIAGNOSIS — F064 Anxiety disorder due to known physiological condition: Secondary | ICD-10-CM | POA: Diagnosis not present

## 2018-12-20 DIAGNOSIS — F331 Major depressive disorder, recurrent, moderate: Secondary | ICD-10-CM | POA: Diagnosis not present

## 2018-12-20 DIAGNOSIS — F0280 Dementia in other diseases classified elsewhere without behavioral disturbance: Secondary | ICD-10-CM | POA: Diagnosis not present

## 2018-12-24 DIAGNOSIS — E785 Hyperlipidemia, unspecified: Secondary | ICD-10-CM | POA: Diagnosis not present

## 2018-12-24 DIAGNOSIS — F339 Major depressive disorder, recurrent, unspecified: Secondary | ICD-10-CM | POA: Diagnosis not present

## 2018-12-24 DIAGNOSIS — I2510 Atherosclerotic heart disease of native coronary artery without angina pectoris: Secondary | ICD-10-CM | POA: Diagnosis not present

## 2018-12-24 DIAGNOSIS — G8929 Other chronic pain: Secondary | ICD-10-CM | POA: Diagnosis not present

## 2018-12-29 DIAGNOSIS — M6281 Muscle weakness (generalized): Secondary | ICD-10-CM | POA: Diagnosis not present

## 2018-12-29 DIAGNOSIS — R296 Repeated falls: Secondary | ICD-10-CM | POA: Diagnosis not present

## 2018-12-29 DIAGNOSIS — I2510 Atherosclerotic heart disease of native coronary artery without angina pectoris: Secondary | ICD-10-CM | POA: Diagnosis not present

## 2018-12-29 DIAGNOSIS — E785 Hyperlipidemia, unspecified: Secondary | ICD-10-CM | POA: Diagnosis not present

## 2018-12-29 DIAGNOSIS — K219 Gastro-esophageal reflux disease without esophagitis: Secondary | ICD-10-CM | POA: Diagnosis not present

## 2018-12-29 DIAGNOSIS — E559 Vitamin D deficiency, unspecified: Secondary | ICD-10-CM | POA: Diagnosis not present

## 2018-12-29 DIAGNOSIS — R339 Retention of urine, unspecified: Secondary | ICD-10-CM | POA: Diagnosis not present

## 2018-12-29 DIAGNOSIS — G8929 Other chronic pain: Secondary | ICD-10-CM | POA: Diagnosis not present

## 2018-12-29 DIAGNOSIS — K5900 Constipation, unspecified: Secondary | ICD-10-CM | POA: Diagnosis not present

## 2018-12-29 DIAGNOSIS — I10 Essential (primary) hypertension: Secondary | ICD-10-CM | POA: Diagnosis not present

## 2018-12-29 DIAGNOSIS — F339 Major depressive disorder, recurrent, unspecified: Secondary | ICD-10-CM | POA: Diagnosis not present

## 2018-12-29 DIAGNOSIS — E039 Hypothyroidism, unspecified: Secondary | ICD-10-CM | POA: Diagnosis not present

## 2019-01-03 DIAGNOSIS — F0280 Dementia in other diseases classified elsewhere without behavioral disturbance: Secondary | ICD-10-CM | POA: Diagnosis not present

## 2019-01-03 DIAGNOSIS — F331 Major depressive disorder, recurrent, moderate: Secondary | ICD-10-CM | POA: Diagnosis not present

## 2019-01-03 DIAGNOSIS — G301 Alzheimer's disease with late onset: Secondary | ICD-10-CM | POA: Diagnosis not present

## 2019-01-03 DIAGNOSIS — F064 Anxiety disorder due to known physiological condition: Secondary | ICD-10-CM | POA: Diagnosis not present

## 2019-01-03 DIAGNOSIS — F5105 Insomnia due to other mental disorder: Secondary | ICD-10-CM | POA: Diagnosis not present

## 2019-01-12 DIAGNOSIS — G894 Chronic pain syndrome: Secondary | ICD-10-CM | POA: Diagnosis not present

## 2019-01-12 DIAGNOSIS — G8929 Other chronic pain: Secondary | ICD-10-CM | POA: Diagnosis not present

## 2019-01-17 DIAGNOSIS — F064 Anxiety disorder due to known physiological condition: Secondary | ICD-10-CM | POA: Diagnosis not present

## 2019-01-17 DIAGNOSIS — G301 Alzheimer's disease with late onset: Secondary | ICD-10-CM | POA: Diagnosis not present

## 2019-01-17 DIAGNOSIS — F0280 Dementia in other diseases classified elsewhere without behavioral disturbance: Secondary | ICD-10-CM | POA: Diagnosis not present

## 2019-01-17 DIAGNOSIS — F5105 Insomnia due to other mental disorder: Secondary | ICD-10-CM | POA: Diagnosis not present

## 2019-01-17 DIAGNOSIS — F331 Major depressive disorder, recurrent, moderate: Secondary | ICD-10-CM | POA: Diagnosis not present

## 2019-01-22 DIAGNOSIS — G8929 Other chronic pain: Secondary | ICD-10-CM | POA: Diagnosis not present

## 2019-01-22 DIAGNOSIS — F331 Major depressive disorder, recurrent, moderate: Secondary | ICD-10-CM | POA: Diagnosis not present

## 2019-01-28 DIAGNOSIS — G8929 Other chronic pain: Secondary | ICD-10-CM | POA: Diagnosis not present

## 2019-01-28 DIAGNOSIS — G894 Chronic pain syndrome: Secondary | ICD-10-CM | POA: Diagnosis not present

## 2019-02-07 DIAGNOSIS — F331 Major depressive disorder, recurrent, moderate: Secondary | ICD-10-CM | POA: Diagnosis not present

## 2019-02-07 DIAGNOSIS — F064 Anxiety disorder due to known physiological condition: Secondary | ICD-10-CM | POA: Diagnosis not present

## 2019-02-07 DIAGNOSIS — F5105 Insomnia due to other mental disorder: Secondary | ICD-10-CM | POA: Diagnosis not present

## 2019-02-07 DIAGNOSIS — F0280 Dementia in other diseases classified elsewhere without behavioral disturbance: Secondary | ICD-10-CM | POA: Diagnosis not present

## 2019-02-07 DIAGNOSIS — G301 Alzheimer's disease with late onset: Secondary | ICD-10-CM | POA: Diagnosis not present

## 2019-02-10 DIAGNOSIS — E039 Hypothyroidism, unspecified: Secondary | ICD-10-CM | POA: Diagnosis not present

## 2019-02-10 DIAGNOSIS — E559 Vitamin D deficiency, unspecified: Secondary | ICD-10-CM | POA: Diagnosis not present

## 2019-02-10 DIAGNOSIS — I2510 Atherosclerotic heart disease of native coronary artery without angina pectoris: Secondary | ICD-10-CM | POA: Diagnosis not present

## 2019-02-10 DIAGNOSIS — K5900 Constipation, unspecified: Secondary | ICD-10-CM | POA: Diagnosis not present

## 2019-02-10 DIAGNOSIS — G8929 Other chronic pain: Secondary | ICD-10-CM | POA: Diagnosis not present

## 2019-02-10 DIAGNOSIS — K219 Gastro-esophageal reflux disease without esophagitis: Secondary | ICD-10-CM | POA: Diagnosis not present

## 2019-02-10 DIAGNOSIS — F339 Major depressive disorder, recurrent, unspecified: Secondary | ICD-10-CM | POA: Diagnosis not present

## 2019-02-10 DIAGNOSIS — M6281 Muscle weakness (generalized): Secondary | ICD-10-CM | POA: Diagnosis not present

## 2019-02-10 DIAGNOSIS — E785 Hyperlipidemia, unspecified: Secondary | ICD-10-CM | POA: Diagnosis not present

## 2019-02-10 DIAGNOSIS — R339 Retention of urine, unspecified: Secondary | ICD-10-CM | POA: Diagnosis not present

## 2019-02-10 DIAGNOSIS — I10 Essential (primary) hypertension: Secondary | ICD-10-CM | POA: Diagnosis not present

## 2019-02-10 DIAGNOSIS — R296 Repeated falls: Secondary | ICD-10-CM | POA: Diagnosis not present

## 2019-02-18 DIAGNOSIS — H1132 Conjunctival hemorrhage, left eye: Secondary | ICD-10-CM | POA: Diagnosis not present

## 2019-02-22 DIAGNOSIS — G8929 Other chronic pain: Secondary | ICD-10-CM | POA: Diagnosis not present

## 2019-02-22 DIAGNOSIS — F331 Major depressive disorder, recurrent, moderate: Secondary | ICD-10-CM | POA: Diagnosis not present

## 2019-03-04 DIAGNOSIS — E559 Vitamin D deficiency, unspecified: Secondary | ICD-10-CM | POA: Diagnosis not present

## 2019-03-04 DIAGNOSIS — K219 Gastro-esophageal reflux disease without esophagitis: Secondary | ICD-10-CM | POA: Diagnosis not present

## 2019-03-04 DIAGNOSIS — F339 Major depressive disorder, recurrent, unspecified: Secondary | ICD-10-CM | POA: Diagnosis not present

## 2019-03-04 DIAGNOSIS — R339 Retention of urine, unspecified: Secondary | ICD-10-CM | POA: Diagnosis not present

## 2019-03-04 DIAGNOSIS — R296 Repeated falls: Secondary | ICD-10-CM | POA: Diagnosis not present

## 2019-03-04 DIAGNOSIS — E039 Hypothyroidism, unspecified: Secondary | ICD-10-CM | POA: Diagnosis not present

## 2019-03-04 DIAGNOSIS — G8929 Other chronic pain: Secondary | ICD-10-CM | POA: Diagnosis not present

## 2019-03-04 DIAGNOSIS — K5900 Constipation, unspecified: Secondary | ICD-10-CM | POA: Diagnosis not present

## 2019-03-04 DIAGNOSIS — M6281 Muscle weakness (generalized): Secondary | ICD-10-CM | POA: Diagnosis not present

## 2019-03-04 DIAGNOSIS — I2510 Atherosclerotic heart disease of native coronary artery without angina pectoris: Secondary | ICD-10-CM | POA: Diagnosis not present

## 2019-03-04 DIAGNOSIS — E785 Hyperlipidemia, unspecified: Secondary | ICD-10-CM | POA: Diagnosis not present

## 2019-03-04 DIAGNOSIS — I10 Essential (primary) hypertension: Secondary | ICD-10-CM | POA: Diagnosis not present

## 2019-03-14 DIAGNOSIS — F0280 Dementia in other diseases classified elsewhere without behavioral disturbance: Secondary | ICD-10-CM | POA: Diagnosis not present

## 2019-03-14 DIAGNOSIS — F5105 Insomnia due to other mental disorder: Secondary | ICD-10-CM | POA: Diagnosis not present

## 2019-03-14 DIAGNOSIS — F331 Major depressive disorder, recurrent, moderate: Secondary | ICD-10-CM | POA: Diagnosis not present

## 2019-03-14 DIAGNOSIS — G301 Alzheimer's disease with late onset: Secondary | ICD-10-CM | POA: Diagnosis not present

## 2019-03-14 DIAGNOSIS — F064 Anxiety disorder due to known physiological condition: Secondary | ICD-10-CM | POA: Diagnosis not present

## 2019-03-18 DIAGNOSIS — G8929 Other chronic pain: Secondary | ICD-10-CM | POA: Diagnosis not present

## 2019-03-18 DIAGNOSIS — G894 Chronic pain syndrome: Secondary | ICD-10-CM | POA: Diagnosis not present

## 2019-03-24 DIAGNOSIS — F331 Major depressive disorder, recurrent, moderate: Secondary | ICD-10-CM | POA: Diagnosis not present

## 2019-03-24 DIAGNOSIS — G8929 Other chronic pain: Secondary | ICD-10-CM | POA: Diagnosis not present

## 2019-04-01 DIAGNOSIS — E785 Hyperlipidemia, unspecified: Secondary | ICD-10-CM | POA: Diagnosis not present

## 2019-04-01 DIAGNOSIS — G8929 Other chronic pain: Secondary | ICD-10-CM | POA: Diagnosis not present

## 2019-04-01 DIAGNOSIS — E559 Vitamin D deficiency, unspecified: Secondary | ICD-10-CM | POA: Diagnosis not present

## 2019-04-01 DIAGNOSIS — F339 Major depressive disorder, recurrent, unspecified: Secondary | ICD-10-CM | POA: Diagnosis not present

## 2019-04-01 DIAGNOSIS — K219 Gastro-esophageal reflux disease without esophagitis: Secondary | ICD-10-CM | POA: Diagnosis not present

## 2019-04-01 DIAGNOSIS — M6281 Muscle weakness (generalized): Secondary | ICD-10-CM | POA: Diagnosis not present

## 2019-04-01 DIAGNOSIS — R339 Retention of urine, unspecified: Secondary | ICD-10-CM | POA: Diagnosis not present

## 2019-04-01 DIAGNOSIS — I2510 Atherosclerotic heart disease of native coronary artery without angina pectoris: Secondary | ICD-10-CM | POA: Diagnosis not present

## 2019-04-01 DIAGNOSIS — K5900 Constipation, unspecified: Secondary | ICD-10-CM | POA: Diagnosis not present

## 2019-04-01 DIAGNOSIS — E039 Hypothyroidism, unspecified: Secondary | ICD-10-CM | POA: Diagnosis not present

## 2019-04-01 DIAGNOSIS — I10 Essential (primary) hypertension: Secondary | ICD-10-CM | POA: Diagnosis not present

## 2019-04-01 DIAGNOSIS — R296 Repeated falls: Secondary | ICD-10-CM | POA: Diagnosis not present

## 2019-04-06 DIAGNOSIS — E119 Type 2 diabetes mellitus without complications: Secondary | ICD-10-CM | POA: Diagnosis not present

## 2019-04-06 DIAGNOSIS — I10 Essential (primary) hypertension: Secondary | ICD-10-CM | POA: Diagnosis not present

## 2019-04-06 DIAGNOSIS — E559 Vitamin D deficiency, unspecified: Secondary | ICD-10-CM | POA: Diagnosis not present

## 2019-04-06 DIAGNOSIS — E785 Hyperlipidemia, unspecified: Secondary | ICD-10-CM | POA: Diagnosis not present

## 2019-04-06 DIAGNOSIS — E039 Hypothyroidism, unspecified: Secondary | ICD-10-CM | POA: Diagnosis not present

## 2019-04-06 DIAGNOSIS — D510 Vitamin B12 deficiency anemia due to intrinsic factor deficiency: Secondary | ICD-10-CM | POA: Diagnosis not present

## 2019-04-11 DIAGNOSIS — F0280 Dementia in other diseases classified elsewhere without behavioral disturbance: Secondary | ICD-10-CM | POA: Diagnosis not present

## 2019-04-11 DIAGNOSIS — F5105 Insomnia due to other mental disorder: Secondary | ICD-10-CM | POA: Diagnosis not present

## 2019-04-11 DIAGNOSIS — F331 Major depressive disorder, recurrent, moderate: Secondary | ICD-10-CM | POA: Diagnosis not present

## 2019-04-11 DIAGNOSIS — F064 Anxiety disorder due to known physiological condition: Secondary | ICD-10-CM | POA: Diagnosis not present

## 2019-04-11 DIAGNOSIS — G301 Alzheimer's disease with late onset: Secondary | ICD-10-CM | POA: Diagnosis not present

## 2019-04-15 DIAGNOSIS — G8929 Other chronic pain: Secondary | ICD-10-CM | POA: Diagnosis not present

## 2019-04-15 DIAGNOSIS — G894 Chronic pain syndrome: Secondary | ICD-10-CM | POA: Diagnosis not present

## 2019-04-24 DIAGNOSIS — G301 Alzheimer's disease with late onset: Secondary | ICD-10-CM | POA: Diagnosis not present

## 2019-04-24 DIAGNOSIS — F331 Major depressive disorder, recurrent, moderate: Secondary | ICD-10-CM | POA: Diagnosis not present

## 2019-04-25 DIAGNOSIS — F064 Anxiety disorder due to known physiological condition: Secondary | ICD-10-CM | POA: Diagnosis not present

## 2019-04-25 DIAGNOSIS — G301 Alzheimer's disease with late onset: Secondary | ICD-10-CM | POA: Diagnosis not present

## 2019-04-25 DIAGNOSIS — F0280 Dementia in other diseases classified elsewhere without behavioral disturbance: Secondary | ICD-10-CM | POA: Diagnosis not present

## 2019-04-25 DIAGNOSIS — F5105 Insomnia due to other mental disorder: Secondary | ICD-10-CM | POA: Diagnosis not present

## 2019-04-25 DIAGNOSIS — F331 Major depressive disorder, recurrent, moderate: Secondary | ICD-10-CM | POA: Diagnosis not present

## 2019-04-29 DIAGNOSIS — K5900 Constipation, unspecified: Secondary | ICD-10-CM | POA: Diagnosis not present

## 2019-04-29 DIAGNOSIS — E785 Hyperlipidemia, unspecified: Secondary | ICD-10-CM | POA: Diagnosis not present

## 2019-04-29 DIAGNOSIS — M6281 Muscle weakness (generalized): Secondary | ICD-10-CM | POA: Diagnosis not present

## 2019-04-29 DIAGNOSIS — I10 Essential (primary) hypertension: Secondary | ICD-10-CM | POA: Diagnosis not present

## 2019-04-29 DIAGNOSIS — K219 Gastro-esophageal reflux disease without esophagitis: Secondary | ICD-10-CM | POA: Diagnosis not present

## 2019-04-29 DIAGNOSIS — R296 Repeated falls: Secondary | ICD-10-CM | POA: Diagnosis not present

## 2019-04-29 DIAGNOSIS — E039 Hypothyroidism, unspecified: Secondary | ICD-10-CM | POA: Diagnosis not present

## 2019-04-29 DIAGNOSIS — E559 Vitamin D deficiency, unspecified: Secondary | ICD-10-CM | POA: Diagnosis not present

## 2019-04-29 DIAGNOSIS — G8929 Other chronic pain: Secondary | ICD-10-CM | POA: Diagnosis not present

## 2019-04-29 DIAGNOSIS — R339 Retention of urine, unspecified: Secondary | ICD-10-CM | POA: Diagnosis not present

## 2019-04-29 DIAGNOSIS — I2510 Atherosclerotic heart disease of native coronary artery without angina pectoris: Secondary | ICD-10-CM | POA: Diagnosis not present

## 2019-04-29 DIAGNOSIS — F339 Major depressive disorder, recurrent, unspecified: Secondary | ICD-10-CM | POA: Diagnosis not present

## 2019-05-09 DIAGNOSIS — Z79899 Other long term (current) drug therapy: Secondary | ICD-10-CM | POA: Diagnosis not present

## 2019-05-12 DIAGNOSIS — G894 Chronic pain syndrome: Secondary | ICD-10-CM | POA: Diagnosis not present

## 2019-05-12 DIAGNOSIS — G8929 Other chronic pain: Secondary | ICD-10-CM | POA: Diagnosis not present

## 2019-05-23 DIAGNOSIS — G301 Alzheimer's disease with late onset: Secondary | ICD-10-CM | POA: Diagnosis not present

## 2019-05-23 DIAGNOSIS — F5105 Insomnia due to other mental disorder: Secondary | ICD-10-CM | POA: Diagnosis not present

## 2019-05-23 DIAGNOSIS — F064 Anxiety disorder due to known physiological condition: Secondary | ICD-10-CM | POA: Diagnosis not present

## 2019-05-23 DIAGNOSIS — F331 Major depressive disorder, recurrent, moderate: Secondary | ICD-10-CM | POA: Diagnosis not present

## 2019-05-23 DIAGNOSIS — F0280 Dementia in other diseases classified elsewhere without behavioral disturbance: Secondary | ICD-10-CM | POA: Diagnosis not present

## 2019-05-24 DIAGNOSIS — F331 Major depressive disorder, recurrent, moderate: Secondary | ICD-10-CM | POA: Diagnosis not present

## 2019-05-24 DIAGNOSIS — G301 Alzheimer's disease with late onset: Secondary | ICD-10-CM | POA: Diagnosis not present

## 2019-06-02 DIAGNOSIS — M25511 Pain in right shoulder: Secondary | ICD-10-CM | POA: Diagnosis not present

## 2019-06-02 DIAGNOSIS — R339 Retention of urine, unspecified: Secondary | ICD-10-CM | POA: Diagnosis not present

## 2019-06-02 DIAGNOSIS — M79621 Pain in right upper arm: Secondary | ICD-10-CM | POA: Diagnosis not present

## 2019-06-02 DIAGNOSIS — K219 Gastro-esophageal reflux disease without esophagitis: Secondary | ICD-10-CM | POA: Diagnosis not present

## 2019-06-02 DIAGNOSIS — F339 Major depressive disorder, recurrent, unspecified: Secondary | ICD-10-CM | POA: Diagnosis not present

## 2019-06-02 DIAGNOSIS — E039 Hypothyroidism, unspecified: Secondary | ICD-10-CM | POA: Diagnosis not present

## 2019-06-02 DIAGNOSIS — E559 Vitamin D deficiency, unspecified: Secondary | ICD-10-CM | POA: Diagnosis not present

## 2019-06-02 DIAGNOSIS — K5900 Constipation, unspecified: Secondary | ICD-10-CM | POA: Diagnosis not present

## 2019-06-02 DIAGNOSIS — G8929 Other chronic pain: Secondary | ICD-10-CM | POA: Diagnosis not present

## 2019-06-02 DIAGNOSIS — I2510 Atherosclerotic heart disease of native coronary artery without angina pectoris: Secondary | ICD-10-CM | POA: Diagnosis not present

## 2019-06-02 DIAGNOSIS — I10 Essential (primary) hypertension: Secondary | ICD-10-CM | POA: Diagnosis not present

## 2019-06-02 DIAGNOSIS — E785 Hyperlipidemia, unspecified: Secondary | ICD-10-CM | POA: Diagnosis not present

## 2019-06-02 DIAGNOSIS — R296 Repeated falls: Secondary | ICD-10-CM | POA: Diagnosis not present

## 2019-06-02 DIAGNOSIS — M6281 Muscle weakness (generalized): Secondary | ICD-10-CM | POA: Diagnosis not present

## 2019-06-17 DIAGNOSIS — G894 Chronic pain syndrome: Secondary | ICD-10-CM | POA: Diagnosis not present

## 2019-06-20 DIAGNOSIS — R300 Dysuria: Secondary | ICD-10-CM | POA: Diagnosis not present

## 2019-06-21 DIAGNOSIS — F0280 Dementia in other diseases classified elsewhere without behavioral disturbance: Secondary | ICD-10-CM | POA: Diagnosis not present

## 2019-06-21 DIAGNOSIS — F064 Anxiety disorder due to known physiological condition: Secondary | ICD-10-CM | POA: Diagnosis not present

## 2019-06-21 DIAGNOSIS — G301 Alzheimer's disease with late onset: Secondary | ICD-10-CM | POA: Diagnosis not present

## 2019-06-21 DIAGNOSIS — F5105 Insomnia due to other mental disorder: Secondary | ICD-10-CM | POA: Diagnosis not present

## 2019-06-21 DIAGNOSIS — F331 Major depressive disorder, recurrent, moderate: Secondary | ICD-10-CM | POA: Diagnosis not present

## 2019-07-08 DIAGNOSIS — E559 Vitamin D deficiency, unspecified: Secondary | ICD-10-CM | POA: Diagnosis not present

## 2019-07-08 DIAGNOSIS — K5900 Constipation, unspecified: Secondary | ICD-10-CM | POA: Diagnosis not present

## 2019-07-08 DIAGNOSIS — R296 Repeated falls: Secondary | ICD-10-CM | POA: Diagnosis not present

## 2019-07-15 DIAGNOSIS — G894 Chronic pain syndrome: Secondary | ICD-10-CM | POA: Diagnosis not present

## 2019-07-19 DIAGNOSIS — G301 Alzheimer's disease with late onset: Secondary | ICD-10-CM | POA: Diagnosis not present

## 2019-07-19 DIAGNOSIS — F0281 Dementia in other diseases classified elsewhere with behavioral disturbance: Secondary | ICD-10-CM | POA: Diagnosis not present

## 2019-07-19 DIAGNOSIS — F5105 Insomnia due to other mental disorder: Secondary | ICD-10-CM | POA: Diagnosis not present

## 2019-07-19 DIAGNOSIS — F331 Major depressive disorder, recurrent, moderate: Secondary | ICD-10-CM | POA: Diagnosis not present

## 2019-07-19 DIAGNOSIS — F064 Anxiety disorder due to known physiological condition: Secondary | ICD-10-CM | POA: Diagnosis not present

## 2019-07-29 DIAGNOSIS — N760 Acute vaginitis: Secondary | ICD-10-CM | POA: Diagnosis not present

## 2019-08-02 DIAGNOSIS — G301 Alzheimer's disease with late onset: Secondary | ICD-10-CM | POA: Diagnosis not present

## 2019-08-02 DIAGNOSIS — F0281 Dementia in other diseases classified elsewhere with behavioral disturbance: Secondary | ICD-10-CM | POA: Diagnosis not present

## 2019-08-02 DIAGNOSIS — F5105 Insomnia due to other mental disorder: Secondary | ICD-10-CM | POA: Diagnosis not present

## 2019-08-02 DIAGNOSIS — F331 Major depressive disorder, recurrent, moderate: Secondary | ICD-10-CM | POA: Diagnosis not present

## 2019-08-02 DIAGNOSIS — F064 Anxiety disorder due to known physiological condition: Secondary | ICD-10-CM | POA: Diagnosis not present

## 2019-08-05 DIAGNOSIS — K219 Gastro-esophageal reflux disease without esophagitis: Secondary | ICD-10-CM | POA: Diagnosis not present

## 2019-08-05 DIAGNOSIS — R296 Repeated falls: Secondary | ICD-10-CM | POA: Diagnosis not present

## 2019-08-05 DIAGNOSIS — E782 Mixed hyperlipidemia: Secondary | ICD-10-CM | POA: Diagnosis not present

## 2019-08-12 DIAGNOSIS — G894 Chronic pain syndrome: Secondary | ICD-10-CM | POA: Diagnosis not present

## 2019-08-16 DIAGNOSIS — G894 Chronic pain syndrome: Secondary | ICD-10-CM | POA: Diagnosis not present

## 2019-08-16 DIAGNOSIS — E782 Mixed hyperlipidemia: Secondary | ICD-10-CM | POA: Diagnosis not present

## 2019-08-16 DIAGNOSIS — F064 Anxiety disorder due to known physiological condition: Secondary | ICD-10-CM | POA: Diagnosis not present

## 2019-08-16 DIAGNOSIS — F5105 Insomnia due to other mental disorder: Secondary | ICD-10-CM | POA: Diagnosis not present

## 2019-08-16 DIAGNOSIS — F331 Major depressive disorder, recurrent, moderate: Secondary | ICD-10-CM | POA: Diagnosis not present

## 2019-08-16 DIAGNOSIS — F0281 Dementia in other diseases classified elsewhere with behavioral disturbance: Secondary | ICD-10-CM | POA: Diagnosis not present

## 2019-08-16 DIAGNOSIS — G301 Alzheimer's disease with late onset: Secondary | ICD-10-CM | POA: Diagnosis not present

## 2019-08-30 DIAGNOSIS — E782 Mixed hyperlipidemia: Secondary | ICD-10-CM | POA: Diagnosis not present

## 2019-08-30 DIAGNOSIS — R339 Retention of urine, unspecified: Secondary | ICD-10-CM | POA: Diagnosis not present

## 2019-08-30 DIAGNOSIS — K219 Gastro-esophageal reflux disease without esophagitis: Secondary | ICD-10-CM | POA: Diagnosis not present

## 2019-08-30 DIAGNOSIS — R296 Repeated falls: Secondary | ICD-10-CM | POA: Diagnosis not present

## 2019-09-02 DIAGNOSIS — N398 Other specified disorders of urinary system: Secondary | ICD-10-CM | POA: Diagnosis not present

## 2019-09-02 DIAGNOSIS — E782 Mixed hyperlipidemia: Secondary | ICD-10-CM | POA: Diagnosis not present

## 2019-09-02 DIAGNOSIS — K5900 Constipation, unspecified: Secondary | ICD-10-CM | POA: Diagnosis not present

## 2019-09-09 DIAGNOSIS — G894 Chronic pain syndrome: Secondary | ICD-10-CM | POA: Diagnosis not present

## 2019-09-12 DIAGNOSIS — I10 Essential (primary) hypertension: Secondary | ICD-10-CM | POA: Diagnosis not present

## 2019-09-12 DIAGNOSIS — E785 Hyperlipidemia, unspecified: Secondary | ICD-10-CM | POA: Diagnosis not present

## 2019-09-13 DIAGNOSIS — G301 Alzheimer's disease with late onset: Secondary | ICD-10-CM | POA: Diagnosis not present

## 2019-09-13 DIAGNOSIS — F064 Anxiety disorder due to known physiological condition: Secondary | ICD-10-CM | POA: Diagnosis not present

## 2019-09-13 DIAGNOSIS — F331 Major depressive disorder, recurrent, moderate: Secondary | ICD-10-CM | POA: Diagnosis not present

## 2019-09-13 DIAGNOSIS — F0281 Dementia in other diseases classified elsewhere with behavioral disturbance: Secondary | ICD-10-CM | POA: Diagnosis not present

## 2019-09-13 DIAGNOSIS — F5105 Insomnia due to other mental disorder: Secondary | ICD-10-CM | POA: Diagnosis not present

## 2019-09-30 DIAGNOSIS — R296 Repeated falls: Secondary | ICD-10-CM | POA: Diagnosis not present

## 2019-09-30 DIAGNOSIS — E559 Vitamin D deficiency, unspecified: Secondary | ICD-10-CM | POA: Diagnosis not present

## 2019-09-30 DIAGNOSIS — K219 Gastro-esophageal reflux disease without esophagitis: Secondary | ICD-10-CM | POA: Diagnosis not present

## 2019-10-07 DIAGNOSIS — G894 Chronic pain syndrome: Secondary | ICD-10-CM | POA: Diagnosis not present

## 2019-10-11 DIAGNOSIS — F0281 Dementia in other diseases classified elsewhere with behavioral disturbance: Secondary | ICD-10-CM | POA: Diagnosis not present

## 2019-10-11 DIAGNOSIS — F331 Major depressive disorder, recurrent, moderate: Secondary | ICD-10-CM | POA: Diagnosis not present

## 2019-10-11 DIAGNOSIS — F064 Anxiety disorder due to known physiological condition: Secondary | ICD-10-CM | POA: Diagnosis not present

## 2019-10-11 DIAGNOSIS — G301 Alzheimer's disease with late onset: Secondary | ICD-10-CM | POA: Diagnosis not present

## 2019-10-11 DIAGNOSIS — F5105 Insomnia due to other mental disorder: Secondary | ICD-10-CM | POA: Diagnosis not present

## 2019-10-21 DIAGNOSIS — F0281 Dementia in other diseases classified elsewhere with behavioral disturbance: Secondary | ICD-10-CM | POA: Diagnosis not present

## 2019-10-21 DIAGNOSIS — F331 Major depressive disorder, recurrent, moderate: Secondary | ICD-10-CM | POA: Diagnosis not present

## 2019-10-21 DIAGNOSIS — F064 Anxiety disorder due to known physiological condition: Secondary | ICD-10-CM | POA: Diagnosis not present

## 2019-10-21 DIAGNOSIS — G301 Alzheimer's disease with late onset: Secondary | ICD-10-CM | POA: Diagnosis not present

## 2019-10-28 DIAGNOSIS — K5900 Constipation, unspecified: Secondary | ICD-10-CM | POA: Diagnosis not present

## 2019-10-28 DIAGNOSIS — I10 Essential (primary) hypertension: Secondary | ICD-10-CM | POA: Diagnosis not present

## 2019-10-28 DIAGNOSIS — D510 Vitamin B12 deficiency anemia due to intrinsic factor deficiency: Secondary | ICD-10-CM | POA: Diagnosis not present

## 2019-10-28 DIAGNOSIS — E785 Hyperlipidemia, unspecified: Secondary | ICD-10-CM | POA: Diagnosis not present

## 2019-10-28 DIAGNOSIS — E119 Type 2 diabetes mellitus without complications: Secondary | ICD-10-CM | POA: Diagnosis not present

## 2019-10-28 DIAGNOSIS — E559 Vitamin D deficiency, unspecified: Secondary | ICD-10-CM | POA: Diagnosis not present

## 2019-10-28 DIAGNOSIS — E039 Hypothyroidism, unspecified: Secondary | ICD-10-CM | POA: Diagnosis not present

## 2019-10-28 DIAGNOSIS — R339 Retention of urine, unspecified: Secondary | ICD-10-CM | POA: Diagnosis not present

## 2019-10-28 DIAGNOSIS — E782 Mixed hyperlipidemia: Secondary | ICD-10-CM | POA: Diagnosis not present

## 2019-11-04 DIAGNOSIS — G894 Chronic pain syndrome: Secondary | ICD-10-CM | POA: Diagnosis not present

## 2019-11-08 DIAGNOSIS — F331 Major depressive disorder, recurrent, moderate: Secondary | ICD-10-CM | POA: Diagnosis not present

## 2019-11-08 DIAGNOSIS — F0281 Dementia in other diseases classified elsewhere with behavioral disturbance: Secondary | ICD-10-CM | POA: Diagnosis not present

## 2019-11-08 DIAGNOSIS — F064 Anxiety disorder due to known physiological condition: Secondary | ICD-10-CM | POA: Diagnosis not present

## 2019-11-08 DIAGNOSIS — F5105 Insomnia due to other mental disorder: Secondary | ICD-10-CM | POA: Diagnosis not present

## 2019-11-08 DIAGNOSIS — G301 Alzheimer's disease with late onset: Secondary | ICD-10-CM | POA: Diagnosis not present

## 2019-11-09 DIAGNOSIS — F99 Mental disorder, not otherwise specified: Secondary | ICD-10-CM | POA: Diagnosis not present

## 2019-11-09 DIAGNOSIS — F331 Major depressive disorder, recurrent, moderate: Secondary | ICD-10-CM | POA: Diagnosis not present

## 2019-11-25 DIAGNOSIS — E559 Vitamin D deficiency, unspecified: Secondary | ICD-10-CM | POA: Diagnosis not present

## 2019-11-25 DIAGNOSIS — R296 Repeated falls: Secondary | ICD-10-CM | POA: Diagnosis not present

## 2019-11-25 DIAGNOSIS — K219 Gastro-esophageal reflux disease without esophagitis: Secondary | ICD-10-CM | POA: Diagnosis not present

## 2019-12-06 DIAGNOSIS — F0281 Dementia in other diseases classified elsewhere with behavioral disturbance: Secondary | ICD-10-CM | POA: Diagnosis not present

## 2019-12-06 DIAGNOSIS — G301 Alzheimer's disease with late onset: Secondary | ICD-10-CM | POA: Diagnosis not present

## 2019-12-06 DIAGNOSIS — F5105 Insomnia due to other mental disorder: Secondary | ICD-10-CM | POA: Diagnosis not present

## 2019-12-06 DIAGNOSIS — F064 Anxiety disorder due to known physiological condition: Secondary | ICD-10-CM | POA: Diagnosis not present

## 2019-12-06 DIAGNOSIS — F331 Major depressive disorder, recurrent, moderate: Secondary | ICD-10-CM | POA: Diagnosis not present

## 2019-12-16 DIAGNOSIS — G894 Chronic pain syndrome: Secondary | ICD-10-CM | POA: Diagnosis not present

## 2019-12-20 DIAGNOSIS — F064 Anxiety disorder due to known physiological condition: Secondary | ICD-10-CM | POA: Diagnosis not present

## 2019-12-20 DIAGNOSIS — G301 Alzheimer's disease with late onset: Secondary | ICD-10-CM | POA: Diagnosis not present

## 2019-12-20 DIAGNOSIS — F0281 Dementia in other diseases classified elsewhere with behavioral disturbance: Secondary | ICD-10-CM | POA: Diagnosis not present

## 2019-12-20 DIAGNOSIS — F331 Major depressive disorder, recurrent, moderate: Secondary | ICD-10-CM | POA: Diagnosis not present

## 2019-12-20 DIAGNOSIS — F5105 Insomnia due to other mental disorder: Secondary | ICD-10-CM | POA: Diagnosis not present

## 2020-01-03 DIAGNOSIS — F5105 Insomnia due to other mental disorder: Secondary | ICD-10-CM | POA: Diagnosis not present

## 2020-01-03 DIAGNOSIS — F331 Major depressive disorder, recurrent, moderate: Secondary | ICD-10-CM | POA: Diagnosis not present

## 2020-01-03 DIAGNOSIS — F0281 Dementia in other diseases classified elsewhere with behavioral disturbance: Secondary | ICD-10-CM | POA: Diagnosis not present

## 2020-01-03 DIAGNOSIS — G301 Alzheimer's disease with late onset: Secondary | ICD-10-CM | POA: Diagnosis not present

## 2020-01-16 DIAGNOSIS — F99 Mental disorder, not otherwise specified: Secondary | ICD-10-CM | POA: Diagnosis not present

## 2020-01-16 DIAGNOSIS — F331 Major depressive disorder, recurrent, moderate: Secondary | ICD-10-CM | POA: Diagnosis not present

## 2020-01-17 DIAGNOSIS — F064 Anxiety disorder due to known physiological condition: Secondary | ICD-10-CM | POA: Diagnosis not present

## 2020-01-17 DIAGNOSIS — F5105 Insomnia due to other mental disorder: Secondary | ICD-10-CM | POA: Diagnosis not present

## 2020-01-17 DIAGNOSIS — F331 Major depressive disorder, recurrent, moderate: Secondary | ICD-10-CM | POA: Diagnosis not present

## 2020-01-17 DIAGNOSIS — F0281 Dementia in other diseases classified elsewhere with behavioral disturbance: Secondary | ICD-10-CM | POA: Diagnosis not present

## 2020-01-17 DIAGNOSIS — G301 Alzheimer's disease with late onset: Secondary | ICD-10-CM | POA: Diagnosis not present

## 2020-01-20 DIAGNOSIS — E782 Mixed hyperlipidemia: Secondary | ICD-10-CM | POA: Diagnosis not present

## 2020-01-20 DIAGNOSIS — N398 Other specified disorders of urinary system: Secondary | ICD-10-CM | POA: Diagnosis not present

## 2020-01-20 DIAGNOSIS — K5900 Constipation, unspecified: Secondary | ICD-10-CM | POA: Diagnosis not present

## 2020-01-31 DIAGNOSIS — G301 Alzheimer's disease with late onset: Secondary | ICD-10-CM | POA: Diagnosis not present

## 2020-01-31 DIAGNOSIS — F064 Anxiety disorder due to known physiological condition: Secondary | ICD-10-CM | POA: Diagnosis not present

## 2020-01-31 DIAGNOSIS — F0281 Dementia in other diseases classified elsewhere with behavioral disturbance: Secondary | ICD-10-CM | POA: Diagnosis not present

## 2020-01-31 DIAGNOSIS — F331 Major depressive disorder, recurrent, moderate: Secondary | ICD-10-CM | POA: Diagnosis not present

## 2020-01-31 DIAGNOSIS — F5105 Insomnia due to other mental disorder: Secondary | ICD-10-CM | POA: Diagnosis not present

## 2020-02-03 DIAGNOSIS — G894 Chronic pain syndrome: Secondary | ICD-10-CM | POA: Diagnosis not present

## 2020-02-14 DIAGNOSIS — F331 Major depressive disorder, recurrent, moderate: Secondary | ICD-10-CM | POA: Diagnosis not present

## 2020-02-14 DIAGNOSIS — F5105 Insomnia due to other mental disorder: Secondary | ICD-10-CM | POA: Diagnosis not present

## 2020-02-14 DIAGNOSIS — F0281 Dementia in other diseases classified elsewhere with behavioral disturbance: Secondary | ICD-10-CM | POA: Diagnosis not present

## 2020-02-14 DIAGNOSIS — F064 Anxiety disorder due to known physiological condition: Secondary | ICD-10-CM | POA: Diagnosis not present

## 2020-02-14 DIAGNOSIS — G301 Alzheimer's disease with late onset: Secondary | ICD-10-CM | POA: Diagnosis not present

## 2020-02-17 DIAGNOSIS — K219 Gastro-esophageal reflux disease without esophagitis: Secondary | ICD-10-CM | POA: Diagnosis not present

## 2020-02-17 DIAGNOSIS — R296 Repeated falls: Secondary | ICD-10-CM | POA: Diagnosis not present

## 2020-02-17 DIAGNOSIS — E559 Vitamin D deficiency, unspecified: Secondary | ICD-10-CM | POA: Diagnosis not present

## 2020-02-28 DIAGNOSIS — F5105 Insomnia due to other mental disorder: Secondary | ICD-10-CM | POA: Diagnosis not present

## 2020-02-28 DIAGNOSIS — G301 Alzheimer's disease with late onset: Secondary | ICD-10-CM | POA: Diagnosis not present

## 2020-02-28 DIAGNOSIS — F064 Anxiety disorder due to known physiological condition: Secondary | ICD-10-CM | POA: Diagnosis not present

## 2020-02-28 DIAGNOSIS — F0281 Dementia in other diseases classified elsewhere with behavioral disturbance: Secondary | ICD-10-CM | POA: Diagnosis not present

## 2020-02-28 DIAGNOSIS — F331 Major depressive disorder, recurrent, moderate: Secondary | ICD-10-CM | POA: Diagnosis not present

## 2020-03-02 DIAGNOSIS — G894 Chronic pain syndrome: Secondary | ICD-10-CM | POA: Diagnosis not present

## 2020-03-13 DIAGNOSIS — F331 Major depressive disorder, recurrent, moderate: Secondary | ICD-10-CM | POA: Diagnosis not present

## 2020-03-13 DIAGNOSIS — F5105 Insomnia due to other mental disorder: Secondary | ICD-10-CM | POA: Diagnosis not present

## 2020-03-13 DIAGNOSIS — G301 Alzheimer's disease with late onset: Secondary | ICD-10-CM | POA: Diagnosis not present

## 2020-03-13 DIAGNOSIS — F0281 Dementia in other diseases classified elsewhere with behavioral disturbance: Secondary | ICD-10-CM | POA: Diagnosis not present

## 2020-03-16 DIAGNOSIS — F99 Mental disorder, not otherwise specified: Secondary | ICD-10-CM | POA: Diagnosis not present

## 2020-03-16 DIAGNOSIS — E782 Mixed hyperlipidemia: Secondary | ICD-10-CM | POA: Diagnosis not present

## 2020-03-16 DIAGNOSIS — R339 Retention of urine, unspecified: Secondary | ICD-10-CM | POA: Diagnosis not present

## 2020-03-16 DIAGNOSIS — K5900 Constipation, unspecified: Secondary | ICD-10-CM | POA: Diagnosis not present

## 2020-03-16 DIAGNOSIS — F331 Major depressive disorder, recurrent, moderate: Secondary | ICD-10-CM | POA: Diagnosis not present

## 2020-03-27 DIAGNOSIS — F0281 Dementia in other diseases classified elsewhere with behavioral disturbance: Secondary | ICD-10-CM | POA: Diagnosis not present

## 2020-03-27 DIAGNOSIS — F331 Major depressive disorder, recurrent, moderate: Secondary | ICD-10-CM | POA: Diagnosis not present

## 2020-03-27 DIAGNOSIS — F064 Anxiety disorder due to known physiological condition: Secondary | ICD-10-CM | POA: Diagnosis not present

## 2020-03-27 DIAGNOSIS — G301 Alzheimer's disease with late onset: Secondary | ICD-10-CM | POA: Diagnosis not present

## 2020-03-27 DIAGNOSIS — F5105 Insomnia due to other mental disorder: Secondary | ICD-10-CM | POA: Diagnosis not present

## 2020-03-30 DIAGNOSIS — G894 Chronic pain syndrome: Secondary | ICD-10-CM | POA: Diagnosis not present

## 2020-04-12 DIAGNOSIS — R296 Repeated falls: Secondary | ICD-10-CM | POA: Diagnosis not present

## 2020-04-12 DIAGNOSIS — E559 Vitamin D deficiency, unspecified: Secondary | ICD-10-CM | POA: Diagnosis not present

## 2020-04-12 DIAGNOSIS — K219 Gastro-esophageal reflux disease without esophagitis: Secondary | ICD-10-CM | POA: Diagnosis not present

## 2020-04-24 DIAGNOSIS — F5105 Insomnia due to other mental disorder: Secondary | ICD-10-CM | POA: Diagnosis not present

## 2020-04-24 DIAGNOSIS — F064 Anxiety disorder due to known physiological condition: Secondary | ICD-10-CM | POA: Diagnosis not present

## 2020-04-24 DIAGNOSIS — F0281 Dementia in other diseases classified elsewhere with behavioral disturbance: Secondary | ICD-10-CM | POA: Diagnosis not present

## 2020-04-24 DIAGNOSIS — G301 Alzheimer's disease with late onset: Secondary | ICD-10-CM | POA: Diagnosis not present

## 2020-04-24 DIAGNOSIS — F331 Major depressive disorder, recurrent, moderate: Secondary | ICD-10-CM | POA: Diagnosis not present

## 2020-04-27 DIAGNOSIS — G894 Chronic pain syndrome: Secondary | ICD-10-CM | POA: Diagnosis not present

## 2020-05-11 DIAGNOSIS — K5900 Constipation, unspecified: Secondary | ICD-10-CM | POA: Diagnosis not present

## 2020-05-11 DIAGNOSIS — R339 Retention of urine, unspecified: Secondary | ICD-10-CM | POA: Diagnosis not present

## 2020-05-11 DIAGNOSIS — E782 Mixed hyperlipidemia: Secondary | ICD-10-CM | POA: Diagnosis not present

## 2020-05-22 DIAGNOSIS — Z79899 Other long term (current) drug therapy: Secondary | ICD-10-CM | POA: Diagnosis not present

## 2020-05-25 DIAGNOSIS — G894 Chronic pain syndrome: Secondary | ICD-10-CM | POA: Diagnosis not present

## 2020-05-29 DIAGNOSIS — F064 Anxiety disorder due to known physiological condition: Secondary | ICD-10-CM | POA: Diagnosis not present

## 2020-05-29 DIAGNOSIS — F5105 Insomnia due to other mental disorder: Secondary | ICD-10-CM | POA: Diagnosis not present

## 2020-05-29 DIAGNOSIS — F331 Major depressive disorder, recurrent, moderate: Secondary | ICD-10-CM | POA: Diagnosis not present

## 2020-05-29 DIAGNOSIS — G301 Alzheimer's disease with late onset: Secondary | ICD-10-CM | POA: Diagnosis not present

## 2020-05-29 DIAGNOSIS — F0281 Dementia in other diseases classified elsewhere with behavioral disturbance: Secondary | ICD-10-CM | POA: Diagnosis not present

## 2020-06-06 DIAGNOSIS — K5900 Constipation, unspecified: Secondary | ICD-10-CM | POA: Diagnosis not present

## 2020-06-06 DIAGNOSIS — K219 Gastro-esophageal reflux disease without esophagitis: Secondary | ICD-10-CM | POA: Diagnosis not present

## 2020-06-06 DIAGNOSIS — I2510 Atherosclerotic heart disease of native coronary artery without angina pectoris: Secondary | ICD-10-CM | POA: Diagnosis not present

## 2020-06-06 DIAGNOSIS — R339 Retention of urine, unspecified: Secondary | ICD-10-CM | POA: Diagnosis not present

## 2020-06-06 DIAGNOSIS — E782 Mixed hyperlipidemia: Secondary | ICD-10-CM | POA: Diagnosis not present

## 2020-06-08 DIAGNOSIS — E559 Vitamin D deficiency, unspecified: Secondary | ICD-10-CM | POA: Diagnosis not present

## 2020-06-08 DIAGNOSIS — R296 Repeated falls: Secondary | ICD-10-CM | POA: Diagnosis not present

## 2020-06-08 DIAGNOSIS — K219 Gastro-esophageal reflux disease without esophagitis: Secondary | ICD-10-CM | POA: Diagnosis not present

## 2020-06-20 DIAGNOSIS — R300 Dysuria: Secondary | ICD-10-CM | POA: Diagnosis not present

## 2020-06-21 DIAGNOSIS — G894 Chronic pain syndrome: Secondary | ICD-10-CM | POA: Diagnosis not present

## 2020-06-26 DIAGNOSIS — F5105 Insomnia due to other mental disorder: Secondary | ICD-10-CM | POA: Diagnosis not present

## 2020-06-26 DIAGNOSIS — F064 Anxiety disorder due to known physiological condition: Secondary | ICD-10-CM | POA: Diagnosis not present

## 2020-06-26 DIAGNOSIS — F331 Major depressive disorder, recurrent, moderate: Secondary | ICD-10-CM | POA: Diagnosis not present

## 2020-06-26 DIAGNOSIS — G301 Alzheimer's disease with late onset: Secondary | ICD-10-CM | POA: Diagnosis not present

## 2020-06-26 DIAGNOSIS — F0281 Dementia in other diseases classified elsewhere with behavioral disturbance: Secondary | ICD-10-CM | POA: Diagnosis not present

## 2020-07-05 DIAGNOSIS — E782 Mixed hyperlipidemia: Secondary | ICD-10-CM | POA: Diagnosis not present

## 2020-07-05 DIAGNOSIS — R339 Retention of urine, unspecified: Secondary | ICD-10-CM | POA: Diagnosis not present

## 2020-07-05 DIAGNOSIS — K5900 Constipation, unspecified: Secondary | ICD-10-CM | POA: Diagnosis not present

## 2020-07-17 DIAGNOSIS — R05 Cough: Secondary | ICD-10-CM | POA: Diagnosis not present

## 2020-07-17 DIAGNOSIS — G894 Chronic pain syndrome: Secondary | ICD-10-CM | POA: Diagnosis not present

## 2020-07-18 DIAGNOSIS — Z7401 Bed confinement status: Secondary | ICD-10-CM | POA: Diagnosis not present

## 2020-07-18 DIAGNOSIS — J90 Pleural effusion, not elsewhere classified: Secondary | ICD-10-CM | POA: Diagnosis not present

## 2020-07-18 DIAGNOSIS — U071 COVID-19: Secondary | ICD-10-CM | POA: Diagnosis not present

## 2020-07-18 DIAGNOSIS — F0390 Unspecified dementia without behavioral disturbance: Secondary | ICD-10-CM | POA: Diagnosis not present

## 2020-07-18 DIAGNOSIS — R05 Cough: Secondary | ICD-10-CM | POA: Diagnosis not present

## 2020-07-18 DIAGNOSIS — Z79891 Long term (current) use of opiate analgesic: Secondary | ICD-10-CM | POA: Diagnosis not present

## 2020-07-18 DIAGNOSIS — Z79899 Other long term (current) drug therapy: Secondary | ICD-10-CM | POA: Diagnosis not present

## 2020-07-18 DIAGNOSIS — Z9229 Personal history of other drug therapy: Secondary | ICD-10-CM | POA: Diagnosis not present

## 2020-07-18 DIAGNOSIS — M2550 Pain in unspecified joint: Secondary | ICD-10-CM | POA: Diagnosis not present

## 2020-07-18 DIAGNOSIS — R0902 Hypoxemia: Secondary | ICD-10-CM | POA: Diagnosis not present

## 2020-07-18 DIAGNOSIS — N390 Urinary tract infection, site not specified: Secondary | ICD-10-CM | POA: Diagnosis not present

## 2020-07-18 DIAGNOSIS — R4182 Altered mental status, unspecified: Secondary | ICD-10-CM | POA: Diagnosis not present

## 2020-07-31 DIAGNOSIS — F0281 Dementia in other diseases classified elsewhere with behavioral disturbance: Secondary | ICD-10-CM | POA: Diagnosis not present

## 2020-07-31 DIAGNOSIS — F331 Major depressive disorder, recurrent, moderate: Secondary | ICD-10-CM | POA: Diagnosis not present

## 2020-07-31 DIAGNOSIS — F5105 Insomnia due to other mental disorder: Secondary | ICD-10-CM | POA: Diagnosis not present

## 2020-07-31 DIAGNOSIS — G301 Alzheimer's disease with late onset: Secondary | ICD-10-CM | POA: Diagnosis not present

## 2020-07-31 DIAGNOSIS — F064 Anxiety disorder due to known physiological condition: Secondary | ICD-10-CM | POA: Diagnosis not present

## 2020-08-02 DIAGNOSIS — E559 Vitamin D deficiency, unspecified: Secondary | ICD-10-CM | POA: Diagnosis not present

## 2020-08-02 DIAGNOSIS — U071 COVID-19: Secondary | ICD-10-CM | POA: Diagnosis not present

## 2020-08-02 DIAGNOSIS — K219 Gastro-esophageal reflux disease without esophagitis: Secondary | ICD-10-CM | POA: Diagnosis not present

## 2020-08-16 DIAGNOSIS — G894 Chronic pain syndrome: Secondary | ICD-10-CM | POA: Diagnosis not present

## 2020-08-28 DIAGNOSIS — F5105 Insomnia due to other mental disorder: Secondary | ICD-10-CM | POA: Diagnosis not present

## 2020-08-28 DIAGNOSIS — F064 Anxiety disorder due to known physiological condition: Secondary | ICD-10-CM | POA: Diagnosis not present

## 2020-08-28 DIAGNOSIS — F331 Major depressive disorder, recurrent, moderate: Secondary | ICD-10-CM | POA: Diagnosis not present

## 2020-08-28 DIAGNOSIS — F0281 Dementia in other diseases classified elsewhere with behavioral disturbance: Secondary | ICD-10-CM | POA: Diagnosis not present

## 2020-08-28 DIAGNOSIS — G301 Alzheimer's disease with late onset: Secondary | ICD-10-CM | POA: Diagnosis not present

## 2020-08-30 DIAGNOSIS — R296 Repeated falls: Secondary | ICD-10-CM | POA: Diagnosis not present

## 2020-08-30 DIAGNOSIS — K5900 Constipation, unspecified: Secondary | ICD-10-CM | POA: Diagnosis not present

## 2020-08-30 DIAGNOSIS — E782 Mixed hyperlipidemia: Secondary | ICD-10-CM | POA: Diagnosis not present

## 2020-09-13 DIAGNOSIS — G894 Chronic pain syndrome: Secondary | ICD-10-CM | POA: Diagnosis not present

## 2020-09-25 DIAGNOSIS — F0281 Dementia in other diseases classified elsewhere with behavioral disturbance: Secondary | ICD-10-CM | POA: Diagnosis not present

## 2020-09-25 DIAGNOSIS — F5105 Insomnia due to other mental disorder: Secondary | ICD-10-CM | POA: Diagnosis not present

## 2020-09-25 DIAGNOSIS — F064 Anxiety disorder due to known physiological condition: Secondary | ICD-10-CM | POA: Diagnosis not present

## 2020-09-25 DIAGNOSIS — G301 Alzheimer's disease with late onset: Secondary | ICD-10-CM | POA: Diagnosis not present

## 2020-09-25 DIAGNOSIS — F331 Major depressive disorder, recurrent, moderate: Secondary | ICD-10-CM | POA: Diagnosis not present

## 2020-09-27 DIAGNOSIS — E559 Vitamin D deficiency, unspecified: Secondary | ICD-10-CM | POA: Diagnosis not present

## 2020-09-27 DIAGNOSIS — R296 Repeated falls: Secondary | ICD-10-CM | POA: Diagnosis not present

## 2020-09-27 DIAGNOSIS — K5900 Constipation, unspecified: Secondary | ICD-10-CM | POA: Diagnosis not present

## 2020-09-27 DIAGNOSIS — E782 Mixed hyperlipidemia: Secondary | ICD-10-CM | POA: Diagnosis not present

## 2020-09-27 DIAGNOSIS — K219 Gastro-esophageal reflux disease without esophagitis: Secondary | ICD-10-CM | POA: Diagnosis not present

## 2020-10-01 DIAGNOSIS — E039 Hypothyroidism, unspecified: Secondary | ICD-10-CM | POA: Diagnosis not present

## 2020-10-01 DIAGNOSIS — E559 Vitamin D deficiency, unspecified: Secondary | ICD-10-CM | POA: Diagnosis not present

## 2020-10-01 DIAGNOSIS — E785 Hyperlipidemia, unspecified: Secondary | ICD-10-CM | POA: Diagnosis not present

## 2020-10-01 DIAGNOSIS — R300 Dysuria: Secondary | ICD-10-CM | POA: Diagnosis not present

## 2020-10-01 DIAGNOSIS — I10 Essential (primary) hypertension: Secondary | ICD-10-CM | POA: Diagnosis not present

## 2020-10-01 DIAGNOSIS — E119 Type 2 diabetes mellitus without complications: Secondary | ICD-10-CM | POA: Diagnosis not present

## 2020-10-01 DIAGNOSIS — D510 Vitamin B12 deficiency anemia due to intrinsic factor deficiency: Secondary | ICD-10-CM | POA: Diagnosis not present

## 2020-10-04 DIAGNOSIS — E782 Mixed hyperlipidemia: Secondary | ICD-10-CM | POA: Diagnosis not present

## 2020-10-04 DIAGNOSIS — K219 Gastro-esophageal reflux disease without esophagitis: Secondary | ICD-10-CM | POA: Diagnosis not present

## 2020-10-04 DIAGNOSIS — R339 Retention of urine, unspecified: Secondary | ICD-10-CM | POA: Diagnosis not present

## 2020-10-04 DIAGNOSIS — K5900 Constipation, unspecified: Secondary | ICD-10-CM | POA: Diagnosis not present

## 2020-10-10 DIAGNOSIS — G894 Chronic pain syndrome: Secondary | ICD-10-CM | POA: Diagnosis not present

## 2020-10-11 DIAGNOSIS — E039 Hypothyroidism, unspecified: Secondary | ICD-10-CM | POA: Diagnosis not present

## 2020-10-11 DIAGNOSIS — I10 Essential (primary) hypertension: Secondary | ICD-10-CM | POA: Diagnosis not present

## 2020-10-11 DIAGNOSIS — E559 Vitamin D deficiency, unspecified: Secondary | ICD-10-CM | POA: Diagnosis not present

## 2020-10-11 DIAGNOSIS — E782 Mixed hyperlipidemia: Secondary | ICD-10-CM | POA: Diagnosis not present

## 2020-10-22 DIAGNOSIS — G301 Alzheimer's disease with late onset: Secondary | ICD-10-CM | POA: Diagnosis not present

## 2020-10-22 DIAGNOSIS — F0281 Dementia in other diseases classified elsewhere with behavioral disturbance: Secondary | ICD-10-CM | POA: Diagnosis not present

## 2020-10-22 DIAGNOSIS — F5105 Insomnia due to other mental disorder: Secondary | ICD-10-CM | POA: Diagnosis not present

## 2020-10-22 DIAGNOSIS — F064 Anxiety disorder due to known physiological condition: Secondary | ICD-10-CM | POA: Diagnosis not present

## 2020-10-22 DIAGNOSIS — F331 Major depressive disorder, recurrent, moderate: Secondary | ICD-10-CM | POA: Diagnosis not present

## 2020-10-31 DIAGNOSIS — K219 Gastro-esophageal reflux disease without esophagitis: Secondary | ICD-10-CM | POA: Diagnosis not present

## 2020-10-31 DIAGNOSIS — F0390 Unspecified dementia without behavioral disturbance: Secondary | ICD-10-CM | POA: Diagnosis not present

## 2020-10-31 DIAGNOSIS — K5901 Slow transit constipation: Secondary | ICD-10-CM | POA: Diagnosis not present

## 2020-10-31 DIAGNOSIS — F5109 Other insomnia not due to a substance or known physiological condition: Secondary | ICD-10-CM | POA: Diagnosis not present

## 2020-11-20 DIAGNOSIS — G301 Alzheimer's disease with late onset: Secondary | ICD-10-CM | POA: Diagnosis not present

## 2020-11-20 DIAGNOSIS — F064 Anxiety disorder due to known physiological condition: Secondary | ICD-10-CM | POA: Diagnosis not present

## 2020-11-20 DIAGNOSIS — F5105 Insomnia due to other mental disorder: Secondary | ICD-10-CM | POA: Diagnosis not present

## 2020-11-20 DIAGNOSIS — F0281 Dementia in other diseases classified elsewhere with behavioral disturbance: Secondary | ICD-10-CM | POA: Diagnosis not present

## 2020-11-21 DIAGNOSIS — F0390 Unspecified dementia without behavioral disturbance: Secondary | ICD-10-CM | POA: Diagnosis not present

## 2020-11-21 DIAGNOSIS — K5901 Slow transit constipation: Secondary | ICD-10-CM | POA: Diagnosis not present

## 2020-11-21 DIAGNOSIS — M1990 Unspecified osteoarthritis, unspecified site: Secondary | ICD-10-CM | POA: Diagnosis not present

## 2020-11-25 DIAGNOSIS — F0280 Dementia in other diseases classified elsewhere without behavioral disturbance: Secondary | ICD-10-CM | POA: Diagnosis not present

## 2020-11-25 DIAGNOSIS — K219 Gastro-esophageal reflux disease without esophagitis: Secondary | ICD-10-CM | POA: Diagnosis not present

## 2020-11-25 DIAGNOSIS — M159 Polyosteoarthritis, unspecified: Secondary | ICD-10-CM | POA: Diagnosis not present

## 2020-11-25 DIAGNOSIS — F419 Anxiety disorder, unspecified: Secondary | ICD-10-CM | POA: Diagnosis not present

## 2020-11-25 DIAGNOSIS — Z96641 Presence of right artificial hip joint: Secondary | ICD-10-CM | POA: Diagnosis not present

## 2020-11-25 DIAGNOSIS — I2510 Atherosclerotic heart disease of native coronary artery without angina pectoris: Secondary | ICD-10-CM | POA: Diagnosis not present

## 2020-11-25 DIAGNOSIS — I10 Essential (primary) hypertension: Secondary | ICD-10-CM | POA: Diagnosis not present

## 2020-11-25 DIAGNOSIS — Z9181 History of falling: Secondary | ICD-10-CM | POA: Diagnosis not present

## 2020-11-25 DIAGNOSIS — G309 Alzheimer's disease, unspecified: Secondary | ICD-10-CM | POA: Diagnosis not present

## 2020-11-25 DIAGNOSIS — F339 Major depressive disorder, recurrent, unspecified: Secondary | ICD-10-CM | POA: Diagnosis not present

## 2020-11-25 DIAGNOSIS — G894 Chronic pain syndrome: Secondary | ICD-10-CM | POA: Diagnosis not present

## 2020-11-25 DIAGNOSIS — M810 Age-related osteoporosis without current pathological fracture: Secondary | ICD-10-CM | POA: Diagnosis not present

## 2020-11-25 DIAGNOSIS — R339 Retention of urine, unspecified: Secondary | ICD-10-CM | POA: Diagnosis not present

## 2020-11-25 DIAGNOSIS — E785 Hyperlipidemia, unspecified: Secondary | ICD-10-CM | POA: Diagnosis not present

## 2020-12-05 DIAGNOSIS — Z96641 Presence of right artificial hip joint: Secondary | ICD-10-CM | POA: Diagnosis not present

## 2020-12-05 DIAGNOSIS — K219 Gastro-esophageal reflux disease without esophagitis: Secondary | ICD-10-CM | POA: Diagnosis not present

## 2020-12-05 DIAGNOSIS — Z20828 Contact with and (suspected) exposure to other viral communicable diseases: Secondary | ICD-10-CM | POA: Diagnosis not present

## 2020-12-05 DIAGNOSIS — F419 Anxiety disorder, unspecified: Secondary | ICD-10-CM | POA: Diagnosis not present

## 2020-12-05 DIAGNOSIS — G894 Chronic pain syndrome: Secondary | ICD-10-CM | POA: Diagnosis not present

## 2020-12-05 DIAGNOSIS — Z9181 History of falling: Secondary | ICD-10-CM | POA: Diagnosis not present

## 2020-12-05 DIAGNOSIS — F339 Major depressive disorder, recurrent, unspecified: Secondary | ICD-10-CM | POA: Diagnosis not present

## 2020-12-05 DIAGNOSIS — F0280 Dementia in other diseases classified elsewhere without behavioral disturbance: Secondary | ICD-10-CM | POA: Diagnosis not present

## 2020-12-05 DIAGNOSIS — G309 Alzheimer's disease, unspecified: Secondary | ICD-10-CM | POA: Diagnosis not present

## 2020-12-05 DIAGNOSIS — M159 Polyosteoarthritis, unspecified: Secondary | ICD-10-CM | POA: Diagnosis not present

## 2020-12-05 DIAGNOSIS — I10 Essential (primary) hypertension: Secondary | ICD-10-CM | POA: Diagnosis not present

## 2020-12-05 DIAGNOSIS — E785 Hyperlipidemia, unspecified: Secondary | ICD-10-CM | POA: Diagnosis not present

## 2020-12-05 DIAGNOSIS — M810 Age-related osteoporosis without current pathological fracture: Secondary | ICD-10-CM | POA: Diagnosis not present

## 2020-12-05 DIAGNOSIS — R339 Retention of urine, unspecified: Secondary | ICD-10-CM | POA: Diagnosis not present

## 2020-12-05 DIAGNOSIS — I2510 Atherosclerotic heart disease of native coronary artery without angina pectoris: Secondary | ICD-10-CM | POA: Diagnosis not present

## 2020-12-12 DIAGNOSIS — Z20828 Contact with and (suspected) exposure to other viral communicable diseases: Secondary | ICD-10-CM | POA: Diagnosis not present

## 2020-12-18 DIAGNOSIS — G301 Alzheimer's disease with late onset: Secondary | ICD-10-CM | POA: Diagnosis not present

## 2020-12-18 DIAGNOSIS — F0281 Dementia in other diseases classified elsewhere with behavioral disturbance: Secondary | ICD-10-CM | POA: Diagnosis not present

## 2020-12-18 DIAGNOSIS — F331 Major depressive disorder, recurrent, moderate: Secondary | ICD-10-CM | POA: Diagnosis not present

## 2020-12-18 DIAGNOSIS — F064 Anxiety disorder due to known physiological condition: Secondary | ICD-10-CM | POA: Diagnosis not present

## 2020-12-18 DIAGNOSIS — F5105 Insomnia due to other mental disorder: Secondary | ICD-10-CM | POA: Diagnosis not present

## 2020-12-24 DIAGNOSIS — G309 Alzheimer's disease, unspecified: Secondary | ICD-10-CM | POA: Diagnosis not present

## 2020-12-24 DIAGNOSIS — M159 Polyosteoarthritis, unspecified: Secondary | ICD-10-CM | POA: Diagnosis not present

## 2020-12-24 DIAGNOSIS — F0280 Dementia in other diseases classified elsewhere without behavioral disturbance: Secondary | ICD-10-CM | POA: Diagnosis not present

## 2020-12-26 DIAGNOSIS — N390 Urinary tract infection, site not specified: Secondary | ICD-10-CM | POA: Diagnosis not present

## 2020-12-26 DIAGNOSIS — Z79899 Other long term (current) drug therapy: Secondary | ICD-10-CM | POA: Diagnosis not present

## 2020-12-27 DIAGNOSIS — Z20828 Contact with and (suspected) exposure to other viral communicable diseases: Secondary | ICD-10-CM | POA: Diagnosis not present

## 2021-01-03 DIAGNOSIS — F0390 Unspecified dementia without behavioral disturbance: Secondary | ICD-10-CM | POA: Diagnosis not present

## 2021-01-03 DIAGNOSIS — M1990 Unspecified osteoarthritis, unspecified site: Secondary | ICD-10-CM | POA: Diagnosis not present

## 2021-01-03 DIAGNOSIS — K5901 Slow transit constipation: Secondary | ICD-10-CM | POA: Diagnosis not present

## 2021-01-08 DIAGNOSIS — Z20828 Contact with and (suspected) exposure to other viral communicable diseases: Secondary | ICD-10-CM | POA: Diagnosis not present

## 2021-01-09 DIAGNOSIS — K219 Gastro-esophageal reflux disease without esophagitis: Secondary | ICD-10-CM | POA: Diagnosis not present

## 2021-01-09 DIAGNOSIS — R21 Rash and other nonspecific skin eruption: Secondary | ICD-10-CM | POA: Diagnosis not present

## 2021-01-09 DIAGNOSIS — F5109 Other insomnia not due to a substance or known physiological condition: Secondary | ICD-10-CM | POA: Diagnosis not present

## 2021-01-09 DIAGNOSIS — F0390 Unspecified dementia without behavioral disturbance: Secondary | ICD-10-CM | POA: Diagnosis not present

## 2021-01-14 DIAGNOSIS — M25562 Pain in left knee: Secondary | ICD-10-CM | POA: Diagnosis not present

## 2021-01-15 DIAGNOSIS — F331 Major depressive disorder, recurrent, moderate: Secondary | ICD-10-CM | POA: Diagnosis not present

## 2021-01-15 DIAGNOSIS — F064 Anxiety disorder due to known physiological condition: Secondary | ICD-10-CM | POA: Diagnosis not present

## 2021-01-15 DIAGNOSIS — F5105 Insomnia due to other mental disorder: Secondary | ICD-10-CM | POA: Diagnosis not present

## 2021-01-15 DIAGNOSIS — F0281 Dementia in other diseases classified elsewhere with behavioral disturbance: Secondary | ICD-10-CM | POA: Diagnosis not present

## 2021-01-15 DIAGNOSIS — Z20828 Contact with and (suspected) exposure to other viral communicable diseases: Secondary | ICD-10-CM | POA: Diagnosis not present

## 2021-01-15 DIAGNOSIS — G301 Alzheimer's disease with late onset: Secondary | ICD-10-CM | POA: Diagnosis not present

## 2021-01-22 DIAGNOSIS — Z20828 Contact with and (suspected) exposure to other viral communicable diseases: Secondary | ICD-10-CM | POA: Diagnosis not present

## 2021-01-30 ENCOUNTER — Other Ambulatory Visit: Payer: Self-pay

## 2021-01-30 ENCOUNTER — Emergency Department (HOSPITAL_BASED_OUTPATIENT_CLINIC_OR_DEPARTMENT_OTHER): Payer: PPO

## 2021-01-30 ENCOUNTER — Emergency Department (HOSPITAL_BASED_OUTPATIENT_CLINIC_OR_DEPARTMENT_OTHER)
Admission: EM | Admit: 2021-01-30 | Discharge: 2021-01-30 | Disposition: A | Discharge status: Home / Self Care | Payer: PPO | Attending: Emergency Medicine | Admitting: Emergency Medicine

## 2021-01-30 ENCOUNTER — Encounter (HOSPITAL_BASED_OUTPATIENT_CLINIC_OR_DEPARTMENT_OTHER): Payer: Self-pay

## 2021-01-30 DIAGNOSIS — R5381 Other malaise: Secondary | ICD-10-CM | POA: Diagnosis not present

## 2021-01-30 DIAGNOSIS — Z87891 Personal history of nicotine dependence: Secondary | ICD-10-CM | POA: Diagnosis not present

## 2021-01-30 DIAGNOSIS — Z7982 Long term (current) use of aspirin: Secondary | ICD-10-CM | POA: Insufficient documentation

## 2021-01-30 DIAGNOSIS — Z20822 Contact with and (suspected) exposure to covid-19: Secondary | ICD-10-CM | POA: Diagnosis not present

## 2021-01-30 DIAGNOSIS — I700 Atherosclerosis of aorta: Secondary | ICD-10-CM | POA: Diagnosis not present

## 2021-01-30 DIAGNOSIS — F0390 Unspecified dementia without behavioral disturbance: Secondary | ICD-10-CM | POA: Insufficient documentation

## 2021-01-30 DIAGNOSIS — R531 Weakness: Secondary | ICD-10-CM | POA: Insufficient documentation

## 2021-01-30 DIAGNOSIS — R000 Tachycardia, unspecified: Secondary | ICD-10-CM | POA: Diagnosis not present

## 2021-01-30 DIAGNOSIS — R197 Diarrhea, unspecified: Secondary | ICD-10-CM

## 2021-01-30 DIAGNOSIS — R0902 Hypoxemia: Secondary | ICD-10-CM | POA: Diagnosis not present

## 2021-01-30 DIAGNOSIS — I959 Hypotension, unspecified: Secondary | ICD-10-CM | POA: Diagnosis not present

## 2021-01-30 LAB — CBC WITH DIFFERENTIAL/PLATELET
Abs Immature Granulocytes: 0.04 10*3/uL (ref 0.00–0.07)
Basophils Absolute: 0 10*3/uL (ref 0.0–0.1)
Basophils Relative: 0 %
Eosinophils Absolute: 0 10*3/uL (ref 0.0–0.5)
Eosinophils Relative: 1 %
HCT: 41.9 % (ref 36.0–46.0)
Hemoglobin: 13.6 g/dL (ref 12.0–15.0)
Immature Granulocytes: 1 %
Lymphocytes Relative: 5 %
Lymphs Abs: 0.4 10*3/uL — ABNORMAL LOW (ref 0.7–4.0)
MCH: 29.6 pg (ref 26.0–34.0)
MCHC: 32.5 g/dL (ref 30.0–36.0)
MCV: 91.3 fL (ref 80.0–100.0)
Monocytes Absolute: 0.5 10*3/uL (ref 0.1–1.0)
Monocytes Relative: 7 %
Neutro Abs: 6.6 10*3/uL (ref 1.7–7.7)
Neutrophils Relative %: 86 %
Platelets: 235 10*3/uL (ref 150–400)
RBC: 4.59 MIL/uL (ref 3.87–5.11)
RDW: 14.9 % (ref 11.5–15.5)
WBC: 7.6 10*3/uL (ref 4.0–10.5)
nRBC: 0 % (ref 0.0–0.2)

## 2021-01-30 LAB — COMPREHENSIVE METABOLIC PANEL
ALT: 26 U/L (ref 0–44)
AST: 26 U/L (ref 15–41)
Albumin: 3.7 g/dL (ref 3.5–5.0)
Alkaline Phosphatase: 74 U/L (ref 38–126)
Anion gap: 9 (ref 5–15)
BUN: 18 mg/dL (ref 8–23)
CO2: 27 mmol/L (ref 22–32)
Calcium: 8.8 mg/dL — ABNORMAL LOW (ref 8.9–10.3)
Chloride: 102 mmol/L (ref 98–111)
Creatinine, Ser: 0.69 mg/dL (ref 0.44–1.00)
GFR, Estimated: >60 mL/min (ref >60)
Glucose, Bld: 130 mg/dL — ABNORMAL HIGH (ref 70–99)
Potassium: 3.6 mmol/L (ref 3.5–5.1)
Sodium: 138 mmol/L (ref 135–145)
Total Bilirubin: 0.6 mg/dL (ref 0.3–1.2)
Total Protein: 6.6 g/dL (ref 6.5–8.1)

## 2021-01-30 LAB — SARS CORONAVIRUS 2 (TAT 6-24 HRS): SARS Coronavirus 2: NEGATIVE

## 2021-01-30 LAB — MAGNESIUM: Magnesium: 1.9 mg/dL (ref 1.7–2.4)

## 2021-01-30 LAB — TROPONIN I (HIGH SENSITIVITY): Troponin I (High Sensitivity): 3 ng/L (ref <18)

## 2021-01-30 MED ORDER — SODIUM CHLORIDE 0.9 % IV BOLUS
1000.0000 mL | Freq: Once | INTRAVENOUS | Status: AC
  Administered 2021-01-30: 1000 mL via INTRAVENOUS

## 2021-01-30 NOTE — ED Notes (Signed)
Attempted report x 2 to the facility. Received no answer. Son was given our phone number and d/c info for any questions concerning the visit.

## 2021-01-30 NOTE — ED Triage Notes (Signed)
Pt presents from memory care facility for generalized weakness x 2 days. Pt is confused at baseline, but does not appear to be in any pain at this time.

## 2021-01-30 NOTE — ED Notes (Signed)
Pt on monitor and vitals cycling 

## 2021-01-30 NOTE — ED Provider Notes (Signed)
[facility] EMERGENCY DEPARTMENT Provider Note   CSN: [id number] Arrival date & time: 01/30/21  1514     History Chief Complaint  Patient presents with  . Weakness    Courtney Gamble is a 87 y.o. female.  Level 5 caveat secondary to dementia.  She is a resident of [facility] memory care unit.  Per EMS has been generally weak for a couple of days.  Recently had diarrhea from a GI bug that was going through the facility.  Patient herself cannot give any reliable history.  The history is provided by the patient, the EMS personnel and a relative.  Weakness Onset quality:  Gradual Duration:  2 days Timing:  Constant Progression:  Worsening Chronicity:  New Context: recent infection   Relieved by:  None tried Worsened by:  Activity Ineffective treatments:  None tried      Past Medical History:  Diagnosis Date  . Interstitial cystitis   . Tachycardia     There are no problems to display for this patient.   Past Surgical History:  Procedure Laterality Date  . CYSTOSTOMY W/ BLADDER BIOPSY       OB History   No obstetric history on file.     No family history on file.  Social History   Tobacco Use  . Smoking status: Former Smoker  Substance Use Topics  . Alcohol use: Never  . Drug use: Never    Home Medications Prior to Admission medications   Medication Sig Start Date End Date Taking? Authorizing Provider  amitriptyline (ELAVIL) 10 MG tablet Take 10 mg by mouth at bedtime.    [provider]  Aspirin (ASPIR-81 PO) Take by mouth.    [provider]  ATENOLOL PO Take by mouth.    [provider]  diazepam (VALIUM) 5 MG tablet Take 5 mg by mouth every 6 (six) hours as needed for anxiety.    [provider]  hydrOXYzine (ATARAX/VISTARIL) 10 MG tablet Take 10 mg by mouth 3 (three) times daily as needed for itching.    [provider]  oxybutynin (DITROPAN) 5 MG tablet Take 5 mg by mouth 3 (three) times daily.     [provider]  Sulfamethoxazole-Trimethoprim (BACTRIM DS PO) Take by mouth.    [provider]  traMADol (ULTRAM) 50 MG tablet Take 50 mg by mouth every 6 (six) hours as needed for pain.    [provider]    Allergies    Ivp dye [iodinated diagnostic agents] and Macrobid [nitrofurantoin macrocrystal]  Review of Systems   Review of Systems  Unable to perform ROS: Dementia  Neurological: Positive for weakness.    Physical Exam Updated Vital Signs BP (!) 116/59 (BP Location: Left Arm)   Pulse 81   Temp 98.1 F (36.7 C) (Oral)   Resp 16   Ht 5\' 6"  (1.676 m)   Wt 63.5 kg   SpO2 95%   BMI 22.60 kg/m   Physical Exam Vitals and nursing note reviewed.  Constitutional:      General: She is not in acute distress.    Appearance: Normal appearance. She is well-developed and well-nourished.  HENT:     Head: Normocephalic and atraumatic.  Eyes:     Conjunctiva/sclera: Conjunctivae normal.  Cardiovascular:     Rate and Rhythm: Normal rate and regular rhythm.     Heart sounds: No murmur heard.   Pulmonary:     Effort: Pulmonary effort is normal. No respiratory distress.  Breath sounds: Normal breath sounds.  Abdominal:     Palpations: Abdomen is soft.     Tenderness: There is no abdominal tenderness. There is no guarding or rebound.  Musculoskeletal:        General: No deformity or edema. Normal range of motion.     Cervical back: Neck supple.  Skin:    General: Skin is warm and dry.     Capillary Refill: Capillary refill takes less than 2 seconds.  Neurological:     General: No focal deficit present.     Mental Status: She is alert. She is disoriented.  Psychiatric:        Mood and Affect: Mood and affect normal.     ED Results / Procedures / Treatments   Labs (all labs ordered are listed, but only abnormal results are displayed) Labs Reviewed  COMPREHENSIVE METABOLIC PANEL - Abnormal; Notable for the following components:      Result  Value   Glucose, Bld 130 (*)    Calcium 8.8 (*)    All other components within normal limits  CBC WITH DIFFERENTIAL/PLATELET - Abnormal; Notable for the following components:   Lymphs Abs 0.4 (*)    All other components within normal limits  SARS CORONAVIRUS 2 (TAT 6-24 HRS)  GASTROINTESTINAL PANEL BY PCR, STOOL (REPLACES STOOL CULTURE)  MAGNESIUM  TROPONIN I (HIGH SENSITIVITY)  TROPONIN I (HIGH SENSITIVITY)    EKG EKG Interpretation  Date/Time:  Wednesday January 30 2021 15:23:35 EST Ventricular Rate:  82 PR Interval:    QRS Duration: 82 QT Interval:  370 QTC Calculation: 433 R Axis:   7 Text Interpretation: Sinus rhythm Abnormal R-wave progression, early transition No significant change since prior 6/07 Confirmed by Aletta Edouard [id number]) on 01/30/2021 3:28:04 PM   Radiology DG Chest Port 1 View  Result Date: 01/30/2021 CLINICAL DATA:  Weakness. EXAM: PORTABLE CHEST 1 VIEW COMPARISON:  July 18, 2020 FINDINGS: The study is limited secondary to patient rotation. Very mild left basilar scarring and/or atelectasis is seen. There is no evidence of acute infiltrate, pleural effusion or pneumothorax. The heart size and mediastinal contours are within normal limits. There is mild calcification of the aortic arch. The visualized skeletal structures are unremarkable. IMPRESSION: Very mild left basilar scarring and/or atelectasis. Electronically Signed   By: Virgina Norfolk M.D.   On: 01/30/2021 15:55    Procedures Procedures   Medications Ordered in ED Medications  sodium chloride 0.9 % bolus 1,000 mL (0 mLs Intravenous Stopped 01/30/21 1700)    ED Course  I have reviewed the triage vital signs and the nursing notes.  Pertinent labs & imaging results that were available during my care of the patient were reviewed by me and considered in my medical decision making (see chart for details).  Clinical Course as of 01/31/21 1011  Wed Jan 30, 2021  1602 Chest x-ray interpreted by me  as some atelectasis left base. [MB]  1632 Daughter is here now and gives a little bit better information.  It sounds like there is been a GI bug at the facility.  Patient started today with 2 loose bowel movements.  Daughter thought she seemed more lethargic.  Patient is asking for something to eat. [MB]  [id number] Assessed patient.  Daughter states she is back to baseline now and wants her to go back to her facility.  She will need an ambulance transport.  I explained that it would be good to get a urine and a stool study but  she is okay with her leaving before that is done [MB]    Clinical Course User Index [MB] Hayden Rasmussen, MD   MDM Rules/Calculators/A&P                         Shaune Pascal was evaluated in Emergency Department on 01/30/2021 for the symptoms described in the history of present illness. She was evaluated in the context of the global COVID-19 pandemic, which necessitated consideration that the patient might be at risk for infection with the SARS-CoV-2 virus that causes COVID-19. Institutional protocols and algorithms that pertain to the evaluation of patients at risk for COVID-19 are in a state of rapid change based on information released by regulatory bodies including the CDC and federal and state organizations. These policies and algorithms were followed during the patient's care in the ED. This patient complains of generalized weakness lethargy and diarrhea; this involves an extensive number of treatment Options and is a complaint that carries with it a high risk of complications and Morbidity. The differential includes gastroenteritis, metabolic derangement, CHF colitis, ACS, Covid  I ordered, reviewed and interpreted labs, which included CBC with normal white count normal hemoglobin, chemistries normal other than elevated glucose likely, normal LFTs, normal.  Stool studies were ordered and not obtained due to patient not having any bowel movements Covid test negative I ordered  medication IV fluids with improvement in patient's mental status I ordered imaging studies which included chest x-ray and I independently    visualized and interpreted imaging which showed atelectasis Additional history obtained from patient's daughter and EMS Previous records obtained and reviewed in epic, no recent admissions  After the interventions stated above, I reevaluated the patient and found patient mental status to be improved.  She is asking for something to eat.  Daughter declines any further work-up.  Would like the patient to return to back to her facility.  Return instructions discussed   Final Clinical Impression(s) / ED Diagnoses Final diagnoses:  Diarrhea, unspecified type  Weakness    Rx / DC Orders ED Discharge Orders    None       Hayden Rasmussen, MD 01/31/21 1014

## 2021-01-30 NOTE — Discharge Instructions (Signed)
You were seen in the emergency department for weakness and diarrhea.  You had lab work done that did not show any obvious abnormalities.  You received some IV fluids while you were here with improvement in your symptoms.  We ordered a stool sample but you did not provide one before you were discharged.  Please follow-up with your regular doctor.  Return to the emergency department if any worsening or concerning symptoms

## 2021-01-31 DIAGNOSIS — F0390 Unspecified dementia without behavioral disturbance: Secondary | ICD-10-CM | POA: Diagnosis not present

## 2021-01-31 DIAGNOSIS — M1990 Unspecified osteoarthritis, unspecified site: Secondary | ICD-10-CM | POA: Diagnosis not present

## 2021-01-31 DIAGNOSIS — K5901 Slow transit constipation: Secondary | ICD-10-CM | POA: Diagnosis not present

## 2021-02-04 DIAGNOSIS — I82409 Acute embolism and thrombosis of unspecified deep veins of unspecified lower extremity: Secondary | ICD-10-CM | POA: Diagnosis not present

## 2021-02-12 DIAGNOSIS — G301 Alzheimer's disease with late onset: Secondary | ICD-10-CM | POA: Diagnosis not present

## 2021-02-12 DIAGNOSIS — F331 Major depressive disorder, recurrent, moderate: Secondary | ICD-10-CM | POA: Diagnosis not present

## 2021-02-12 DIAGNOSIS — F064 Anxiety disorder due to known physiological condition: Secondary | ICD-10-CM | POA: Diagnosis not present

## 2021-02-12 DIAGNOSIS — F5105 Insomnia due to other mental disorder: Secondary | ICD-10-CM | POA: Diagnosis not present

## 2021-02-12 DIAGNOSIS — F0281 Dementia in other diseases classified elsewhere with behavioral disturbance: Secondary | ICD-10-CM | POA: Diagnosis not present

## 2021-02-13 DIAGNOSIS — K5901 Slow transit constipation: Secondary | ICD-10-CM | POA: Diagnosis not present

## 2021-02-13 DIAGNOSIS — F5109 Other insomnia not due to a substance or known physiological condition: Secondary | ICD-10-CM | POA: Diagnosis not present

## 2021-02-13 DIAGNOSIS — F0390 Unspecified dementia without behavioral disturbance: Secondary | ICD-10-CM | POA: Diagnosis not present

## 2021-03-03 DIAGNOSIS — K5901 Slow transit constipation: Secondary | ICD-10-CM | POA: Diagnosis not present

## 2021-03-03 DIAGNOSIS — M1990 Unspecified osteoarthritis, unspecified site: Secondary | ICD-10-CM | POA: Diagnosis not present

## 2021-03-03 DIAGNOSIS — F0390 Unspecified dementia without behavioral disturbance: Secondary | ICD-10-CM | POA: Diagnosis not present

## 2021-03-10 DIAGNOSIS — F99 Mental disorder, not otherwise specified: Secondary | ICD-10-CM | POA: Diagnosis not present

## 2021-03-10 DIAGNOSIS — I2510 Atherosclerotic heart disease of native coronary artery without angina pectoris: Secondary | ICD-10-CM | POA: Diagnosis not present

## 2021-03-10 DIAGNOSIS — G894 Chronic pain syndrome: Secondary | ICD-10-CM | POA: Diagnosis not present

## 2021-03-12 DIAGNOSIS — F5105 Insomnia due to other mental disorder: Secondary | ICD-10-CM | POA: Diagnosis not present

## 2021-03-12 DIAGNOSIS — F0281 Dementia in other diseases classified elsewhere with behavioral disturbance: Secondary | ICD-10-CM | POA: Diagnosis not present

## 2021-03-12 DIAGNOSIS — F064 Anxiety disorder due to known physiological condition: Secondary | ICD-10-CM | POA: Diagnosis not present

## 2021-03-12 DIAGNOSIS — G301 Alzheimer's disease with late onset: Secondary | ICD-10-CM | POA: Diagnosis not present

## 2021-03-12 DIAGNOSIS — F331 Major depressive disorder, recurrent, moderate: Secondary | ICD-10-CM | POA: Diagnosis not present

## 2021-03-18 IMAGING — DX DG CHEST 1V PORT
1 series · 1 of 1 positions shown · non-contrast
Comparison: July 18, 2020

CLINICAL DATA: Weakness.

EXAM:
PORTABLE CHEST 1 VIEW

[chest ap]
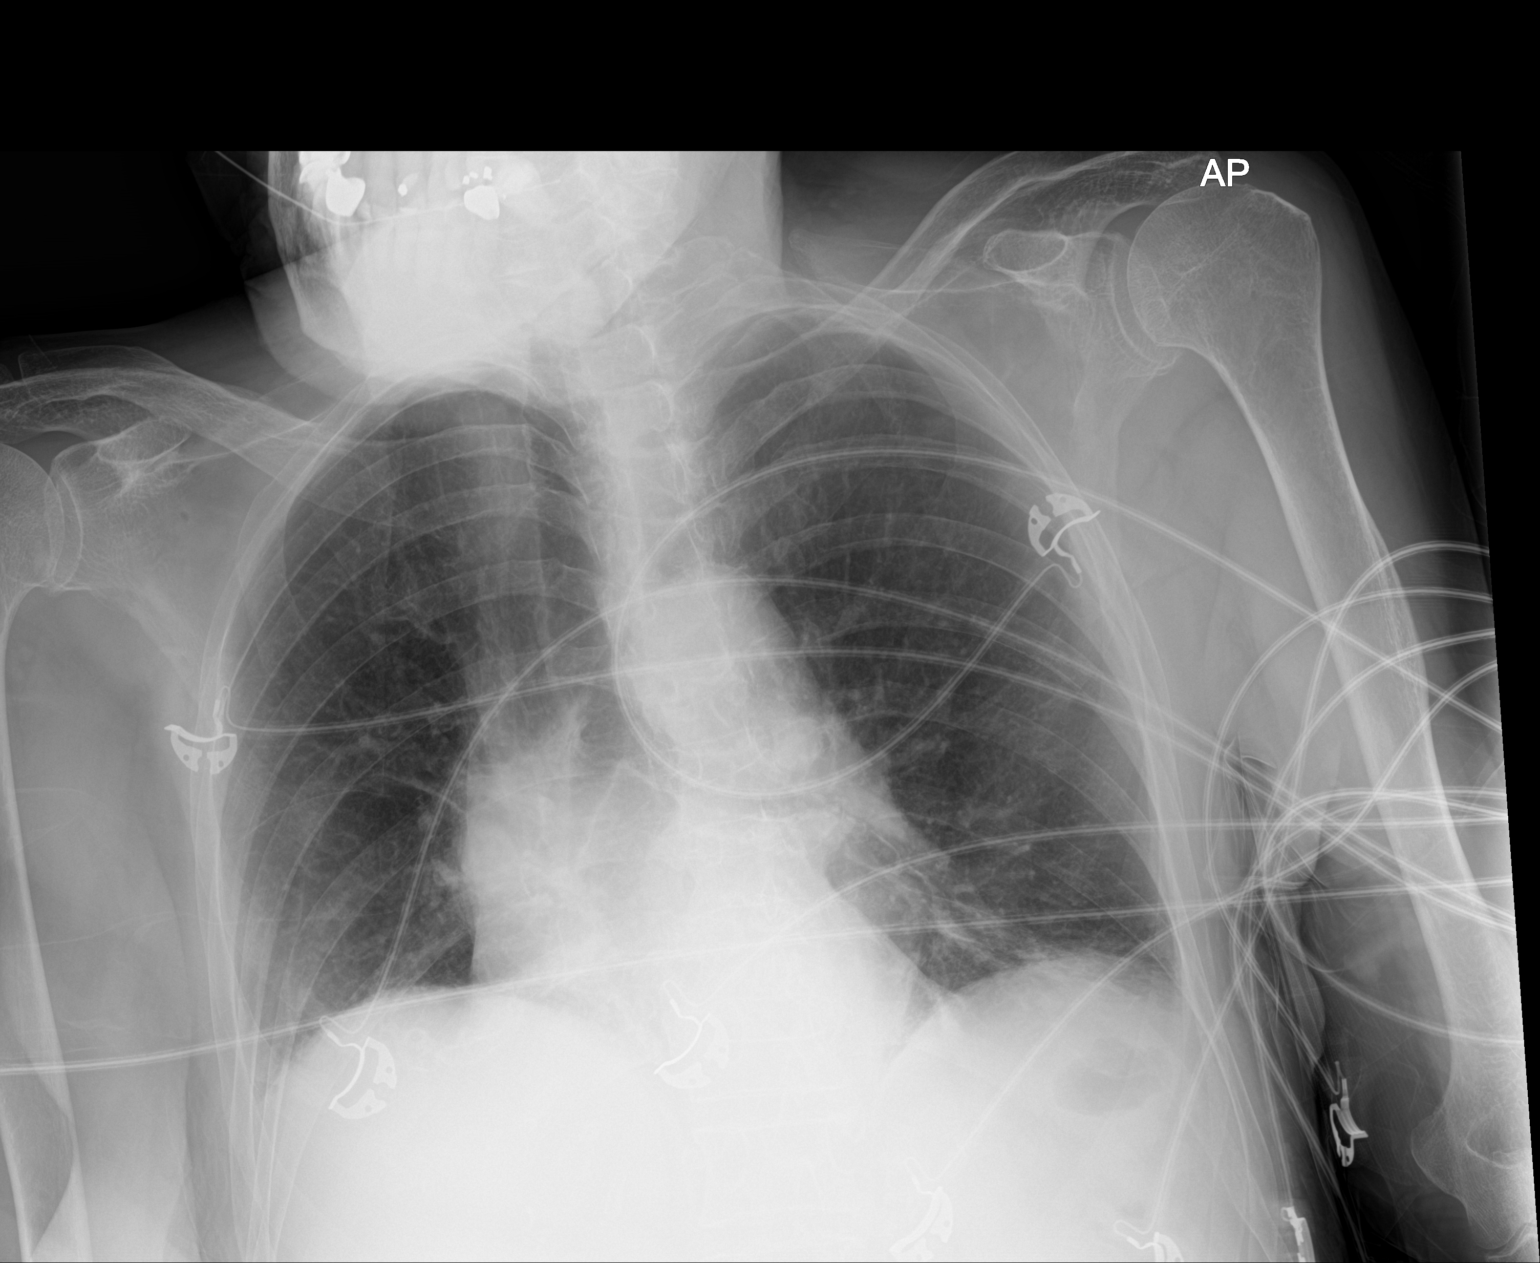

[1 of 1 positions shown; findings below may reference images not displayed]

FINDINGS: The study is limited secondary to patient rotation. Very mild left
basilar scarring and/or atelectasis is seen. There is no evidence of
acute infiltrate, pleural effusion or pneumothorax. The heart size
and mediastinal contours are within normal limits. There is mild
calcification of the aortic arch. The visualized skeletal structures
are unremarkable.
IMPRESSION: Very mild left basilar scarring and/or atelectasis.

## 2021-03-25 DIAGNOSIS — L600 Ingrowing nail: Secondary | ICD-10-CM | POA: Diagnosis not present

## 2021-03-25 DIAGNOSIS — M79675 Pain in left toe(s): Secondary | ICD-10-CM | POA: Diagnosis not present

## 2021-03-27 DIAGNOSIS — K5901 Slow transit constipation: Secondary | ICD-10-CM | POA: Diagnosis not present

## 2021-03-27 DIAGNOSIS — K219 Gastro-esophageal reflux disease without esophagitis: Secondary | ICD-10-CM | POA: Diagnosis not present

## 2021-03-27 DIAGNOSIS — F0390 Unspecified dementia without behavioral disturbance: Secondary | ICD-10-CM | POA: Diagnosis not present

## 2021-04-02 DIAGNOSIS — M1990 Unspecified osteoarthritis, unspecified site: Secondary | ICD-10-CM | POA: Diagnosis not present

## 2021-04-02 DIAGNOSIS — K5901 Slow transit constipation: Secondary | ICD-10-CM | POA: Diagnosis not present

## 2021-04-02 DIAGNOSIS — F0390 Unspecified dementia without behavioral disturbance: Secondary | ICD-10-CM | POA: Diagnosis not present

## 2021-04-09 DIAGNOSIS — F5105 Insomnia due to other mental disorder: Secondary | ICD-10-CM | POA: Diagnosis not present

## 2021-04-09 DIAGNOSIS — G301 Alzheimer's disease with late onset: Secondary | ICD-10-CM | POA: Diagnosis not present

## 2021-04-09 DIAGNOSIS — F0281 Dementia in other diseases classified elsewhere with behavioral disturbance: Secondary | ICD-10-CM | POA: Diagnosis not present

## 2021-04-09 DIAGNOSIS — F331 Major depressive disorder, recurrent, moderate: Secondary | ICD-10-CM | POA: Diagnosis not present

## 2021-04-09 DIAGNOSIS — F064 Anxiety disorder due to known physiological condition: Secondary | ICD-10-CM | POA: Diagnosis not present

## 2021-04-11 DIAGNOSIS — K5901 Slow transit constipation: Secondary | ICD-10-CM | POA: Diagnosis not present

## 2021-04-11 DIAGNOSIS — Z1152 Encounter for screening for COVID-19: Secondary | ICD-10-CM | POA: Diagnosis not present

## 2021-04-11 DIAGNOSIS — Z20828 Contact with and (suspected) exposure to other viral communicable diseases: Secondary | ICD-10-CM | POA: Diagnosis not present

## 2021-04-17 DIAGNOSIS — F0390 Unspecified dementia without behavioral disturbance: Secondary | ICD-10-CM | POA: Diagnosis not present

## 2021-04-17 DIAGNOSIS — K5901 Slow transit constipation: Secondary | ICD-10-CM | POA: Diagnosis not present

## 2021-04-17 DIAGNOSIS — M1990 Unspecified osteoarthritis, unspecified site: Secondary | ICD-10-CM | POA: Diagnosis not present

## 2021-04-24 DIAGNOSIS — Z20828 Contact with and (suspected) exposure to other viral communicable diseases: Secondary | ICD-10-CM | POA: Diagnosis not present

## 2021-04-29 DIAGNOSIS — Z20828 Contact with and (suspected) exposure to other viral communicable diseases: Secondary | ICD-10-CM | POA: Diagnosis not present

## 2021-05-01 DIAGNOSIS — K5901 Slow transit constipation: Secondary | ICD-10-CM | POA: Diagnosis not present

## 2021-05-01 DIAGNOSIS — F0390 Unspecified dementia without behavioral disturbance: Secondary | ICD-10-CM | POA: Diagnosis not present

## 2021-05-01 DIAGNOSIS — K219 Gastro-esophageal reflux disease without esophagitis: Secondary | ICD-10-CM | POA: Diagnosis not present

## 2021-05-07 DIAGNOSIS — F064 Anxiety disorder due to known physiological condition: Secondary | ICD-10-CM | POA: Diagnosis not present

## 2021-05-07 DIAGNOSIS — F331 Major depressive disorder, recurrent, moderate: Secondary | ICD-10-CM | POA: Diagnosis not present

## 2021-05-07 DIAGNOSIS — F5105 Insomnia due to other mental disorder: Secondary | ICD-10-CM | POA: Diagnosis not present

## 2021-05-07 DIAGNOSIS — G301 Alzheimer's disease with late onset: Secondary | ICD-10-CM | POA: Diagnosis not present

## 2021-05-07 DIAGNOSIS — F0281 Dementia in other diseases classified elsewhere with behavioral disturbance: Secondary | ICD-10-CM | POA: Diagnosis not present

## 2021-05-08 DIAGNOSIS — R0602 Shortness of breath: Secondary | ICD-10-CM | POA: Diagnosis not present

## 2021-05-08 DIAGNOSIS — Z853 Personal history of malignant neoplasm of breast: Secondary | ICD-10-CM | POA: Diagnosis not present

## 2021-05-08 DIAGNOSIS — Z96641 Presence of right artificial hip joint: Secondary | ICD-10-CM | POA: Diagnosis not present

## 2021-05-08 DIAGNOSIS — Z66 Do not resuscitate: Secondary | ICD-10-CM | POA: Diagnosis not present

## 2021-05-08 DIAGNOSIS — J9601 Acute respiratory failure with hypoxia: Secondary | ICD-10-CM | POA: Diagnosis not present

## 2021-05-08 DIAGNOSIS — J189 Pneumonia, unspecified organism: Secondary | ICD-10-CM | POA: Diagnosis not present

## 2021-05-08 DIAGNOSIS — R0689 Other abnormalities of breathing: Secondary | ICD-10-CM | POA: Diagnosis not present

## 2021-05-08 DIAGNOSIS — I2510 Atherosclerotic heart disease of native coronary artery without angina pectoris: Secondary | ICD-10-CM | POA: Diagnosis not present

## 2021-05-08 DIAGNOSIS — J80 Acute respiratory distress syndrome: Secondary | ICD-10-CM | POA: Diagnosis not present

## 2021-05-08 DIAGNOSIS — F0280 Dementia in other diseases classified elsewhere without behavioral disturbance: Secondary | ICD-10-CM | POA: Diagnosis not present

## 2021-05-08 DIAGNOSIS — G8929 Other chronic pain: Secondary | ICD-10-CM | POA: Diagnosis not present

## 2021-05-08 DIAGNOSIS — G309 Alzheimer's disease, unspecified: Secondary | ICD-10-CM | POA: Diagnosis not present

## 2021-05-08 DIAGNOSIS — R0902 Hypoxemia: Secondary | ICD-10-CM | POA: Diagnosis not present

## 2021-05-08 DIAGNOSIS — Z515 Encounter for palliative care: Secondary | ICD-10-CM | POA: Diagnosis not present

## 2021-05-08 DIAGNOSIS — J9691 Respiratory failure, unspecified with hypoxia: Secondary | ICD-10-CM | POA: Diagnosis not present

## 2021-05-08 DIAGNOSIS — Z20822 Contact with and (suspected) exposure to covid-19: Secondary | ICD-10-CM | POA: Diagnosis not present

## 2021-05-08 DIAGNOSIS — Z9012 Acquired absence of left breast and nipple: Secondary | ICD-10-CM | POA: Diagnosis not present

## 2021-05-08 DIAGNOSIS — J181 Lobar pneumonia, unspecified organism: Secondary | ICD-10-CM | POA: Diagnosis not present

## 2021-05-18 DIAGNOSIS — F0390 Unspecified dementia without behavioral disturbance: Secondary | ICD-10-CM | POA: Diagnosis not present

## 2021-05-18 DIAGNOSIS — M1990 Unspecified osteoarthritis, unspecified site: Secondary | ICD-10-CM | POA: Diagnosis not present

## 2021-05-18 DIAGNOSIS — K5901 Slow transit constipation: Secondary | ICD-10-CM | POA: Diagnosis not present

## 2021-05-24 DEATH — deceased
